# Patient Record
Sex: Female | Born: 1975 | Hispanic: No | Marital: Married | Smoking: Never smoker
Health system: Southern US, Community
[De-identification: ages and names within clinical notes are randomized; demographics above are authoritative.]

## PROBLEM LIST (undated history)

## (undated) DIAGNOSIS — F99 Mental disorder, not otherwise specified: Secondary | ICD-10-CM

## (undated) DIAGNOSIS — F32A Depression, unspecified: Secondary | ICD-10-CM

## (undated) DIAGNOSIS — F329 Major depressive disorder, single episode, unspecified: Secondary | ICD-10-CM

---

## 2000-04-11 ENCOUNTER — Other Ambulatory Visit: Admission: RE | Admit: 2000-04-11 | Discharge: 2000-04-11 | Payer: Self-pay | Admitting: Obstetrics and Gynecology

## 2000-10-20 ENCOUNTER — Inpatient Hospital Stay (HOSPITAL_COMMUNITY): Admission: AD | Admit: 2000-10-20 | Discharge: 2000-10-23 | Payer: Self-pay | Admitting: Obstetrics

## 2002-06-21 ENCOUNTER — Other Ambulatory Visit: Admission: RE | Admit: 2002-06-21 | Discharge: 2002-06-21 | Payer: Self-pay | Admitting: Obstetrics and Gynecology

## 2002-12-07 ENCOUNTER — Inpatient Hospital Stay (HOSPITAL_COMMUNITY): Admission: AD | Admit: 2002-12-07 | Discharge: 2002-12-09 | Payer: Self-pay | Admitting: *Deleted

## 2006-11-02 ENCOUNTER — Inpatient Hospital Stay (HOSPITAL_COMMUNITY): Admission: AD | Admit: 2006-11-02 | Discharge: 2006-11-04 | Payer: Self-pay | Admitting: Obstetrics

## 2011-03-05 ENCOUNTER — Inpatient Hospital Stay (HOSPITAL_COMMUNITY)
Admission: AD | Admit: 2011-03-05 | Discharge: 2011-03-05 | Disposition: A | Discharge status: Home / Self Care | Payer: Self-pay | Source: Ambulatory Visit | Attending: Obstetrics | Admitting: Obstetrics

## 2011-03-05 DIAGNOSIS — O368190 Decreased fetal movements, unspecified trimester, not applicable or unspecified: Secondary | ICD-10-CM | POA: Insufficient documentation

## 2011-03-08 ENCOUNTER — Inpatient Hospital Stay (HOSPITAL_COMMUNITY): Admission: AD | Admit: 2011-03-08 | Payer: Self-pay | Admitting: Obstetrics

## 2011-03-23 ENCOUNTER — Inpatient Hospital Stay (HOSPITAL_COMMUNITY)
Admission: AD | Admit: 2011-03-23 | Discharge: 2011-03-25 | DRG: 775 | Disposition: A | Discharge status: Home / Self Care | Payer: Medicaid Other | Source: Ambulatory Visit | Attending: Obstetrics | Admitting: Obstetrics

## 2011-03-23 LAB — RPR: RPR Ser Ql: NONREACTIVE

## 2011-03-23 LAB — CBC
HCT: 37.2 % (ref 36.0–46.0)
MCH: 27.8 pg (ref 26.0–34.0)
MCHC: 33.6 g/dL (ref 30.0–36.0)
RDW: 14.7 % (ref 11.5–15.5)

## 2011-03-24 LAB — CBC
HCT: 33 % — ABNORMAL LOW (ref 36.0–46.0)
Hemoglobin: 10.9 g/dL — ABNORMAL LOW (ref 12.0–15.0)
MCH: 27.7 pg (ref 26.0–34.0)
MCHC: 33 g/dL (ref 30.0–36.0)
RDW: 14.9 % (ref 11.5–15.5)

## 2012-09-15 ENCOUNTER — Inpatient Hospital Stay (HOSPITAL_COMMUNITY)
Admission: AD | Admit: 2012-09-15 | Discharge: 2012-09-16 | DRG: 775 | Disposition: A | Discharge status: Home / Self Care | Payer: Medicaid Other | Source: Ambulatory Visit | Attending: Obstetrics | Admitting: Obstetrics

## 2012-09-15 ENCOUNTER — Encounter (HOSPITAL_COMMUNITY): Payer: Self-pay | Admitting: *Deleted

## 2012-09-15 HISTORY — DX: Depression, unspecified: F32.A

## 2012-09-15 HISTORY — DX: Major depressive disorder, single episode, unspecified: F32.9

## 2012-09-15 HISTORY — DX: Mental disorder, not otherwise specified: F99

## 2012-09-15 MED ORDER — LANOLIN HYDROUS EX OINT: TOPICAL_OINTMENT | CUTANEOUS | Status: DC | PRN

## 2012-09-15 MED ORDER — OXYCODONE-ACETAMINOPHEN 5-325 MG PO TABS: 1.0000 | ORAL_TABLET | ORAL | Status: DC | PRN

## 2012-09-15 MED ORDER — OXYTOCIN 10 UNIT/ML IJ SOLN
10.0000 [IU] | Freq: Once | INTRAMUSCULAR | Status: AC
  Administered 2012-09-15: 10 [IU] via INTRAMUSCULAR

## 2012-09-15 MED ORDER — DIPHENHYDRAMINE HCL 25 MG PO CAPS: 25.0000 mg | ORAL_CAPSULE | Freq: Four times a day (QID) | ORAL | Status: DC | PRN

## 2012-09-15 MED ORDER — BENZOCAINE-MENTHOL 20-0.5 % EX AERO: 1.0000 "application " | INHALATION_SPRAY | CUTANEOUS | Status: DC | PRN

## 2012-09-15 MED ORDER — SENNOSIDES-DOCUSATE SODIUM 8.6-50 MG PO TABS
2.0000 | ORAL_TABLET | Freq: Every day | ORAL | Status: DC
  Administered 2012-09-15: 2 via ORAL

## 2012-09-15 MED ORDER — ONDANSETRON HCL 4 MG PO TABS: 4.0000 mg | ORAL_TABLET | ORAL | Status: DC | PRN

## 2012-09-15 MED ORDER — OXYTOCIN 10 UNIT/ML IJ SOLN
INTRAMUSCULAR | Status: AC
  Filled 2012-09-15: qty 1

## 2012-09-15 MED ORDER — PRENATAL MULTIVITAMIN CH
1.0000 | ORAL_TABLET | Freq: Every day | ORAL | Status: DC
  Administered 2012-09-15 – 2012-09-16 (×2): 1 via ORAL
  Filled 2012-09-15 (×2): qty 1

## 2012-09-15 MED ORDER — TETANUS-DIPHTH-ACELL PERTUSSIS 5-2.5-18.5 LF-MCG/0.5 IM SUSP
0.5000 mL | Freq: Once | INTRAMUSCULAR | Status: AC
  Administered 2012-09-16: 0.5 mL via INTRAMUSCULAR
  Filled 2012-09-15: qty 0.5

## 2012-09-15 MED ORDER — SIMETHICONE 80 MG PO CHEW: 80.0000 mg | CHEWABLE_TABLET | ORAL | Status: DC | PRN

## 2012-09-15 MED ORDER — ERYTHROMYCIN 5 MG/GM OP OINT
TOPICAL_OINTMENT | OPHTHALMIC | Status: AC
  Filled 2012-09-15: qty 1

## 2012-09-15 MED ORDER — DIBUCAINE 1 % RE OINT: 1.0000 "application " | TOPICAL_OINTMENT | RECTAL | Status: DC | PRN

## 2012-09-15 MED ORDER — ZOLPIDEM TARTRATE 5 MG PO TABS: 5.0000 mg | ORAL_TABLET | Freq: Every evening | ORAL | Status: DC | PRN

## 2012-09-15 MED ORDER — FERROUS SULFATE 325 (65 FE) MG PO TABS
325.0000 mg | ORAL_TABLET | Freq: Two times a day (BID) | ORAL | Status: DC
  Administered 2012-09-15 – 2012-09-16 (×3): 325 mg via ORAL
  Filled 2012-09-15 (×3): qty 1

## 2012-09-15 MED ORDER — ONDANSETRON HCL 4 MG/2ML IJ SOLN: 4.0000 mg | INTRAMUSCULAR | Status: DC | PRN

## 2012-09-15 MED ORDER — IBUPROFEN 600 MG PO TABS
600.0000 mg | ORAL_TABLET | Freq: Four times a day (QID) | ORAL | Status: DC
  Administered 2012-09-15 – 2012-09-16 (×6): 600 mg via ORAL
  Filled 2012-09-15 (×7): qty 1

## 2012-09-15 MED ORDER — WITCH HAZEL-GLYCERIN EX PADS: 1.0000 "application " | MEDICATED_PAD | CUTANEOUS | Status: DC | PRN

## 2012-09-15 NOTE — Progress Notes (Signed)
Patient ID: Olivia Raymond, female   DOB: [birth year], 36 y.o.   MRN: [id number] Vital signs normal fundus firm lochia moderate no complaints doing well and

## 2012-09-15 NOTE — H&P (Signed)
This is Dr. Francoise Ceo dictating the history and physical on  Olivia Raymond she's a 36 year old gravida 5  For all all 4 at 68 weeks and 6 days [name] 09/16/2012 negative GBS the patient states her contractions began at home she had 2 contractions her membranes ruptured and she came to the hospital where she was delivered by the midwife in triage she had a female Apgar 89 placenta was spontaneous there were no lacerations and she received 10 units of Pitocin IM post delivery Past medical history negative Past surgical history negative Family history negative System review negative Physical exam well-developed female post delivery HEENT negative Breasts negative Heart regular rhythm no murmurs no gallops Lungs clear Abdomen 20 week size postpartum Pelvic there were no lacerations the uterus was firm and her lochia was moderate Extremities negative

## 2012-09-15 NOTE — MAU Note (Signed)
PT ARRIVED - BROUGHT TO ROOM VIA W/C.    VE  COMPLETE.   DELIVERED  VIABLE BABY GIRT AT 0440.

## 2012-09-15 NOTE — Progress Notes (Signed)
UR chart review completed.  

## 2012-09-15 NOTE — MAU Note (Signed)
Call to come to desk to get pt at 0433,  Per interpreter it is pt's 5th baby, pt very umcomfortable, taken to Room 5, SVE complete, call to faculty to standby, call to [facility] to come for delivery, SVD at [phone]

## 2012-09-15 NOTE — MAU Note (Signed)
DR MARSHALL  IN ROOM

## 2012-09-16 LAB — CBC
HCT: 31.9 % — ABNORMAL LOW (ref 36.0–46.0)
Hemoglobin: 10.7 g/dL — ABNORMAL LOW (ref 12.0–15.0)
MCHC: 33.5 g/dL (ref 30.0–36.0)

## 2012-09-16 NOTE — Discharge Summary (Signed)
Obstetric Discharge Summary Reason for Admission: onset of labor Prenatal Procedures: none Intrapartum Procedures: spontaneous vaginal delivery Postpartum Procedures: none Complications-Operative and Postpartum: none Hemoglobin  Date Value Range Status  09/16/2012 10.7* 12.0 - 15.0 g/dL Final     HCT  Date Value Range Status  09/16/2012 31.9* 36.0 - 46.0 % Final    Physical Exam:  General: alert Lochia: appropriate Uterine Fundus: firm Incision: healing well DVT Evaluation: No evidence of DVT seen on physical exam.  Discharge Diagnoses: Term Pregnancy-delivered  Discharge Information: Date: 09/16/2012 Activity: pelvic rest Diet: routine Medications: Percocet Condition: stable Instructions: refer to practice specific booklet Discharge to: home Follow-up Information    Call in 6 weeks to follow up.         Newborn Data: Live born female  Birth Weight: 8 lb 13.6 oz (4015 g) APGAR: 8, 9  Home with mother.  MARSHALL,BERNARD A 09/16/2012, 8:19 AM

## 2012-09-16 NOTE — Discharge Instructions (Signed)
Discharge instructions   You can wash your hair  Shower  Eat what you want  Drink what you want  See me in 6 weeks  Your ankles are going to swell more in the next 2 weeks than when pregnant  No sex for 6 weeks   Olivia Raymond A, MD 09/16/2012

## 2012-09-16 NOTE — Progress Notes (Signed)
Patient ID: Olivia Raymond, female   DOB: 1976-02-11, 36 y.o.   MRN: [id number]  postpartum day one Vital signs normal Fundus firm Lochia moderate No complaints wants discharge and and

## 2012-09-21 ENCOUNTER — Telehealth (HOSPITAL_COMMUNITY): Payer: Self-pay | Admitting: *Deleted

## 2012-09-21 NOTE — Telephone Encounter (Signed)
Resolve episode 

## 2014-08-08 ENCOUNTER — Encounter (HOSPITAL_COMMUNITY): Payer: Self-pay | Admitting: *Deleted

## 2018-10-07 NOTE — L&D Delivery Note (Addendum)
OB/GYN Faculty Practice Delivery Note  Olivia Raymond is a 43 y.o. [id number] s/p SVD at [redacted]w[redacted]d. She was admitted for SROM.   ROM: rupture date, rupture time, delivery date, or delivery time have not been documented with clear fluid GBS Status:  --Henderson Cloud (10/23 1106) Maximum Maternal Temperature:  Temp (24hrs), Avg:98.3 F (36.8 C), Min:98.2 F (36.8 C), Max:98.3 F (36.8 C)    Labor Progress: . Patient arrived at 4 cm dilation and labor progressed spontaneously.   Delivery Date/Time: 08/07/2019 at 2353 Delivery: Called to room and patient was complete and pushing. Head delivered in ROA position. No nuchal cord present. Shoulder and body delivered in usual fashion. Infant with spontaneous cry, placed on mother's abdomen, dried and stimulated. Cord clamped x 2 after 1-minute delay, and cut by FOB. Cord blood drawn. Placenta delivered spontaneously with gentle cord traction. Fundus firm with massage and Pitocin. Labia, perineum, vagina, and cervix inspected with no tears.   Placenta: Spontaneous, intact, central cord insertion, three-vessel cord Complications: None Lacerations: None EBL: 100 mL Analgesia: None  Infant: APGAR (1 MIN): 9   APGAR (5 MINS): 9    Weight: Pending  Gifford Shave, MD  PGY-1, [facility]  08/07/2019 12:15 AM  Patient is a 43 y.o. at [redacted]w[redacted]d who was admitted for SROM, significant hx of A1GDM, LGA, AMA.  She progressed without augmentation.  I was gloved and present for delivery in its entirety.  Second stage of labor progressed, baby delivered after 2 contractions.   Complications: none  Lacerations: none   Wende Mott, CNM 1:04 AM

## 2019-02-05 ENCOUNTER — Ambulatory Visit (INDEPENDENT_AMBULATORY_CARE_PROVIDER_SITE_OTHER): Payer: Self-pay | Admitting: Obstetrics & Gynecology

## 2019-02-05 ENCOUNTER — Other Ambulatory Visit: Payer: Self-pay

## 2019-02-05 ENCOUNTER — Encounter: Payer: Self-pay | Admitting: Obstetrics & Gynecology

## 2019-02-05 DIAGNOSIS — Z124 Encounter for screening for malignant neoplasm of cervix: Secondary | ICD-10-CM

## 2019-02-05 DIAGNOSIS — O09529 Supervision of elderly multigravida, unspecified trimester: Secondary | ICD-10-CM | POA: Insufficient documentation

## 2019-02-05 DIAGNOSIS — N898 Other specified noninflammatory disorders of vagina: Secondary | ICD-10-CM

## 2019-02-05 DIAGNOSIS — Z3480 Encounter for supervision of other normal pregnancy, unspecified trimester: Secondary | ICD-10-CM | POA: Insufficient documentation

## 2019-02-05 DIAGNOSIS — Z113 Encounter for screening for infections with a predominantly sexual mode of transmission: Secondary | ICD-10-CM

## 2019-02-05 DIAGNOSIS — O09521 Supervision of elderly multigravida, first trimester: Secondary | ICD-10-CM

## 2019-02-05 DIAGNOSIS — Z3A11 11 weeks gestation of pregnancy: Secondary | ICD-10-CM

## 2019-02-05 NOTE — Patient Instructions (Signed)
Embarazo despus de los 35aos  Pregnancy After Age 41  Las mujeres que quedan embarazadas despus de los 35aos corren un mayor riesgo de tener ciertos problemas en el embarazo. Esto se debe a que posiblemente las mujeres mayores ya tengan problemas de salud antes de quedar embarazadas. Las mujeres mayores que gozan de buena salud antes del embarazo pueden tener problemas durante el embarazo de todos modos. Estos problemas pueden afectar a la madre, al beb en gestacin (feto) o a ambos.  Cules son los riesgos para m?  Si tiene ms de 35aos y desea quedar embarazada o est embarazada, es posible que corra un riesgo mayor de que suceda lo siguiente:   No poder quedar embarazada (infertilidad).   Que el trabajo de parto comience antes (trabajo de parto prematuro).   Requerir un parto quirrgico (parto por cesrea).   Tener presin arterial alta (hipertensin).   Tener complicaciones durante el embarazo, como hipertensin arterial y otros sntomas (preeclampsia).   Tener diabetes durante el embarazo (diabetes gestacional).   Estar embarazada de ms de un beb.   Perder el beb en gestacin antes de las 20semanas (aborto espontneo) o despus de las 20semanas (muerte fetal) de embarazo.  Cules son los riesgos para el beb?  Los bebs cuyas madres tienen ms de 35aos de edad corren un riesgo mayor de que suceda lo siguiente:   Nacer antes de tiempo (prematuramente).   Tener bajo peso al nacer, es decir, menos de 5libras y 8onzas (2,5kg).   Tener defectos congnitos, como sndrome de Down y fisura palatina.   Tener complicaciones de salud, como problemas de crecimiento y desarrollo.  En qu difiere el cuidado prenatal para las mujeres de ms de 35aos?  Todas las mujeres deben visitar al mdico antes de intentar quedar embarazadas. Esto es especialmente importante para las mujeres de ms de 35aos. Informe al mdico acerca de lo siguiente:   Cualquier problema de salud que tenga.   Los  medicamentos que toma.   Los antecedentes familiares de problemas de salud o defectos relacionados con cromosomas.   Cualquier problema que haya tenido con embarazos o partos anteriores.  Si tiene ms de 35aos de edad y est planeando quedar embarazada:   Comience a tomar un multivitamnico diario un mes antes de intentar quedar embarazada o ms. El multivitamnico debe contener 400?g (microgramos) de cido flico.  Si tiene ms de 35aos y est embarazada, asegrese de:   Seguir tomando el multivitamnico a menos que el mdico le diga que no lo haga.   Concurra a todas las visitas prenatales tal como se lo haya indicado el mdico. Esto es importante.   Hgase ecografas con regularidad a lo largo de todo el embarazo para detectar cualquier problema.   Hable con el mdico sobre otras pruebas de deteccin prenatales que podra necesitar.  Qu pruebas prenatales adicionales son necesarias?  Las pruebas de deteccin muestran si el beb corre un riesgo ms alto de tener defectos congnitos que otros bebs. Las siguientes son algunas de las pruebas de deteccin:   Ecografas para buscar marcadores que indican un riesgo de defectos congnitos.   Anlisis de deteccin en sangre materna. Estos son anlisis de sangre que miden ciertas sustancias en la sangre materna para determinar qu riesgo hay de que el beb tenga defectos.  Las pruebas de deteccin no indican si el beb tiene o no defectos. Solo indican el riesgo que existe de que el beb tenga ciertos defectos. Si las pruebas de deteccin indican que   hay algn factor de riesgo presente, quizs deba hacerse estudios para confirmar el defecto (pruebas de diagnstico). Estos estudios pueden incluir los siguientes:   Muestras de vellosidades corinicas. Para este procedimiento, se toma una muestra de tejido del rgano que se forma en el tero para nutrir al beb (placenta). La muestra se extrae a travs del cuello uterino o del abdomen y se la  analiza.   Amniocentesis. Para este procedimiento, se extrae y se analiza una pequea cantidad del lquido que rodea al beb en el tero (lquido amnitico).  Qu puedo hacer para preservar mi salud durante el embarazo?  Gozar de buena salud durante el embarazo puede ayudar a que exista un riesgo menor de que usted y el beb tengan problemas durante el embarazo, el parto o ambos. Hable con el mdico para que le d indicaciones especficas sobre cmo preservar su salud durante el embarazo.  Nutricin     En cada comida, consuma alimentos variados de cada uno de los cinco grupos. Estos grupos incluyen lo siguiente:  ? Protenas, por ejemplo, carnes magras, carne de ave, pescado con bajo contenido de grasas, frijoles, huevos y frutos secos.  ? Verduras, por ejemplo, de hoja, verduras crudas y cocidas, y jugo de verduras.  ? Frutas frescas, congeladas o enlatadas, o jugos 100% de frutas.  ? Productos lcteos, como yogur, queso y leche descremados.  ? Productos integrales, como arroz, cereales, pastas y pan.   Hable con el mdico sobre qu cantidad de alimentos de cada grupo es adecuada para usted.   Siga las indicaciones del mdico respecto a las restricciones de comidas y bebidas durante el embarazo.  ? No consuma huevos crudos, carne cruda ni pescado o mariscos crudos.  ? No consuma ningn pescado que contenga grandes cantidades de mercurio, como el pez espada o la caballa.   Beba entre 6y 8vasos de agua por da o ms. Debe beber suficiente cantidad de lquido para mantener la orina de color amarillo plido.  Cmo controlar el aumento de peso   Pregntele al mdico qu aumento de peso es saludable durante del embarazo.   Mantenga un peso saludable. De ser necesario, trabaje con su mdico para bajar de peso de manera segura.  Actividad   Haga ejercicio regularmente como se lo haya indicado el mdico. Consulte al mdico qu tipos de ejercicios son seguros para usted.  Instrucciones generales   No consuma  ningn producto que contenga nicotina o tabaco, como cigarrillos y cigarrillos electrnicos. Si necesita ayuda para dejar de fumar, consulte al mdico.   No beba alcohol, no consuma drogas y no abuse de los medicamentos recetados.   Tome los medicamentos de venta libre y los recetados solamente como se lo haya indicado el mdico.   No se d baos de inmersin en agua caliente, baos turcos ni saunas.   Hable con su mdico sobre el riesgo de exposicin a condiciones ambientales nocivas. Esto incluye la exposicin a sustancias qumicas, radiacin, productos de limpieza y heces de gato. Siga el consejo de su mdico respecto a cmo limitar la exposicin.  Resumen   Las mujeres que quedan embarazadas despus de los 35aos corren un mayor riesgo de tener complicaciones en el embarazo.   Los problemas pueden afectar a la madre, al beb en gestacin (feto) o a ambos.   Todas las mujeres deben visitar al mdico antes de intentar quedar embarazadas. Esto es especialmente importante para las mujeres de ms de 35aos.   Gozar de buena salud durante el embarazo   puede ayudar a que exista un riesgo menor de que tanto usted como el beb tengan algunos de los problemas que pueden surgir durante el embarazo, el parto o ambos.  Esta informacin no tiene como fin reemplazar el consejo del mdico. Asegrese de hacerle al mdico cualquier pregunta que tenga.  Document Released: 06/05/2017 Document Revised: 06/05/2017 Document Reviewed: 06/05/2017  Elsevier Interactive Patient Education  2019 Elsevier Inc.

## 2019-02-05 NOTE — Progress Notes (Signed)
  Subjective:    Olivia Raymond is a [id number] [redacted]w[redacted]d being seen today for her first obstetrical visit.  Her obstetrical history is significant for advanced maternal age. Patient does intend to breast feed. Pregnancy history fully reviewed.  Patient reports nausea.  Vitals:   02/05/19 0852  BP: 114/78  Pulse: 86  Temp: (!) 97.1 F (36.2 C)  Weight: 185 lb 14.4 oz (84.3 kg)    HISTORY: OB History  Gravida Para Term Preterm AB Living  6 5 5     5   SAB TAB Ectopic Multiple Live Births          5    # Outcome Date GA Lbr Len/2nd Weight Sex Delivery Anes PTL Lv  6 Current           5 Term 09/15/12 [redacted]w[redacted]d   F Vag-Spont None  LIV  4 Term 03/23/11     Vag-Spont   LIV  3 Term 11/02/06     Vag-Spont   LIV  2 Term 12/07/02     Vag-Spont   LIV  1 Term 10/21/00     Vag-Spont   LIV   Past Medical History:  Diagnosis Date  . Depression   . Mental disorder    History reviewed. No pertinent surgical history. History reviewed. No pertinent family history.   Exam    Uterus:     Pelvic Exam:    Perineum: No Hemorrhoids   Vulva: normal   Vagina:  normal mucosa   pH:     Cervix: no lesions   Adnexa: normal adnexa   Bony Pelvis: average  System: Breast:  normal appearance, no masses or tenderness   Skin: normal coloration and turgor, no rashes    Neurologic: oriented, normal mood   Extremities: normal strength, tone, and muscle mass   HEENT PERRLA   Mouth/Teeth mucous membranes moist, pharynx normal without lesions and dental hygiene good   Neck supple   Cardiovascular: regular rate and rhythm, no murmurs or gallops   Respiratory:  appears well, vitals normal, no respiratory distress, acyanotic, normal RR, neck free of mass or lymphadenopathy, chest clear, no wheezing, crepitations, rhonchi, normal symmetric air entry   Abdomen: soft, non-tender; bowel sounds normal; no masses,  no organomegaly   Urinary: urethral meatus normal      Assessment:    Pregnancy:  [id number] Patient Active Problem List   Diagnosis Date Noted  . Supervision of other normal pregnancy, antepartum 02/05/2019  . AMA (advanced maternal age) multigravida 35+ 02/05/2019        Plan:     Initial labs drawn. Prenatal vitamins. Problem list reviewed and updated. Genetic Screening discussed Panorama ordered Detailed U/S at [facility] Babyscripts Hb A1c  Scheryl Darter 02/05/2019

## 2019-02-05 NOTE — Progress Notes (Signed)
Patient is in the office for NOB, denies pain. [employer]. Pt wants genetic testing, provided application for compassionate care.

## 2019-02-06 LAB — OBSTETRIC PANEL, INCLUDING HIV
Antibody Screen: NEGATIVE
Basophils Absolute: 0.1 10*3/uL (ref 0.0–0.2)
Basos: 1 %
EOS (ABSOLUTE): 0.1 10*3/uL (ref 0.0–0.4)
Eos: 1 %
HIV Screen 4th Generation wRfx: NONREACTIVE
Hematocrit: 40.3 % (ref 34.0–46.6)
Hemoglobin: 13.2 g/dL (ref 11.1–15.9)
Hepatitis B Surface Ag: NEGATIVE
Immature Grans (Abs): 0.1 10*3/uL (ref 0.0–0.1)
Immature Granulocytes: 1 %
Lymphocytes Absolute: 2.3 10*3/uL (ref 0.7–3.1)
Lymphs: 26 %
MCH: 28.4 pg (ref 26.6–33.0)
MCHC: 32.8 g/dL (ref 31.5–35.7)
MCV: 87 fL (ref 79–97)
Monocytes Absolute: 0.5 10*3/uL (ref 0.1–0.9)
Monocytes: 5 %
Neutrophils Absolute: 5.9 10*3/uL (ref 1.4–7.0)
Neutrophils: 66 %
Platelets: 272 10*3/uL (ref 150–450)
RBC: 4.64 x10E6/uL (ref 3.77–5.28)
RDW: 13.3 % (ref 11.7–15.4)
RPR Ser Ql: NONREACTIVE
Rh Factor: POSITIVE
Rubella Antibodies, IGG: 5.68 index (ref >0.99)
WBC: 8.8 10*3/uL (ref 3.4–10.8)

## 2019-02-07 LAB — CULTURE, OB URINE

## 2019-02-07 LAB — URINE CULTURE, OB REFLEX

## 2019-02-09 LAB — CERVICOVAGINAL ANCILLARY ONLY
Bacterial vaginitis: NEGATIVE
Candida vaginitis: NEGATIVE
Chlamydia: NEGATIVE
Neisseria Gonorrhea: NEGATIVE
Trichomonas: NEGATIVE

## 2019-02-11 LAB — CYTOLOGY - PAP
Diagnosis: NEGATIVE
HPV: NOT DETECTED

## 2019-02-15 ENCOUNTER — Encounter: Payer: Self-pay | Admitting: Obstetrics & Gynecology

## 2019-02-15 DIAGNOSIS — Z3480 Encounter for supervision of other normal pregnancy, unspecified trimester: Secondary | ICD-10-CM

## 2019-04-02 ENCOUNTER — Ambulatory Visit (INDEPENDENT_AMBULATORY_CARE_PROVIDER_SITE_OTHER): Payer: Self-pay | Admitting: Obstetrics and Gynecology

## 2019-04-02 ENCOUNTER — Encounter: Payer: Self-pay | Admitting: Obstetrics and Gynecology

## 2019-04-02 ENCOUNTER — Telehealth: Payer: Self-pay | Admitting: *Deleted

## 2019-04-02 VITALS — BP 110/70 | HR 89

## 2019-04-02 DIAGNOSIS — O09522 Supervision of elderly multigravida, second trimester: Secondary | ICD-10-CM

## 2019-04-02 DIAGNOSIS — Z3480 Encounter for supervision of other normal pregnancy, unspecified trimester: Secondary | ICD-10-CM

## 2019-04-02 DIAGNOSIS — Z789 Other specified health status: Secondary | ICD-10-CM

## 2019-04-02 DIAGNOSIS — Z758 Other problems related to medical facilities and other health care: Secondary | ICD-10-CM

## 2019-04-02 DIAGNOSIS — Z3A19 19 weeks gestation of pregnancy: Secondary | ICD-10-CM

## 2019-04-02 NOTE — Progress Notes (Addendum)
   TELEHEALTH VIRTUAL OBSTETRICS VISIT ENCOUNTER NOTE  I connected with Olivia Raymond on 04/02/19 at  8:45 AM EDT by telephone at home and verified that I am speaking with the correct person using two identifiers.   I discussed the limitations, risks, security and privacy concerns of performing an evaluation and management service by telephone and the availability of in person appointments. I also discussed with the patient that there may be a patient responsible charge related to this service. The patient expressed understanding and agreed to proceed.  Subjective:  Olivia Raymond is a 43 y.o. [id number] at [redacted]w[redacted]d being followed for ongoing prenatal care.  She is currently monitored for the following issues for this high-risk pregnancy and has Supervision of other normal pregnancy, antepartum; AMA (advanced maternal age) multigravida 35+; and Language barrier on their problem list.  Patient reports no complaints. Reports fetal movement. Denies any contractions, bleeding or leaking of fluid.   The following portions of the patient's history were reviewed and updated as appropriate: allergies, current medications, past family history, past medical history, past social history, past surgical history and problem list.   Objective:   BP 110/70, pulse 89  General:  Alert, oriented and cooperative.   Mental Status: Normal mood and affect perceived. Normal judgment and thought content.  Rest of physical exam deferred due to type of encounter  Assessment and Plan:  Pregnancy: G6P5005 at [redacted]w[redacted]d  1. Supervision of other normal pregnancy, antepartum Requested anatomy scan results from [facility] Reviewed other results  2. Multigravida of advanced maternal age in second trimester Testing starting 36 weeks  3. Language barrier Patent attorney used  4. Low lying placenta Ultrasound available after I hung up with patient, spine not well seen and noted to have low lying  placenta < 4 mm from os - RN to call patient and review pelvic precautions with her, that this will most likely resolve as her pregnancy progresses - repeat US at [place] 1 month  Preterm labor symptoms and general obstetric precautions including but not limited to vaginal bleeding, contractions, leaking of fluid and fetal movement were reviewed in detail with the patient.  I discussed the assessment and treatment plan with the patient. The patient was provided an opportunity to ask questions and all were answered. The patient agreed with the plan and demonstrated an understanding of the instructions. The patient was advised to call back or seek an in-person office evaluation/go to [facility] at [facility] for any urgent or concerning symptoms. Please refer to After Visit Summary for other counseling recommendations.   I provided 15 minutes of non-face-to-face time during this encounter.  Return in about 4 weeks (around 04/30/2019) for OB visit, virtual.  No future appointments.  Sloan Leiter, MD Center for [place], [place]

## 2019-04-02 NOTE — Telephone Encounter (Signed)
Intrepreter ID # [id number] used for today's telephone call.  Pt was made aware that she needs follow up u/s due to incomplete anatomy and low lying placenta, per Dr Rosana Hoes. Pt was given pelvic precautions until given clearance by provider.   Pt was given appt date/time for u/s appt and ROB appt.  Pt states understanding and has no other concerns.

## 2019-04-30 ENCOUNTER — Encounter: Payer: Self-pay | Admitting: Obstetrics and Gynecology

## 2019-04-30 ENCOUNTER — Ambulatory Visit (INDEPENDENT_AMBULATORY_CARE_PROVIDER_SITE_OTHER): Payer: Self-pay | Admitting: Obstetrics and Gynecology

## 2019-04-30 VITALS — BP 114/70 | HR 76

## 2019-04-30 DIAGNOSIS — Z3A23 23 weeks gestation of pregnancy: Secondary | ICD-10-CM

## 2019-04-30 DIAGNOSIS — Z789 Other specified health status: Secondary | ICD-10-CM

## 2019-04-30 DIAGNOSIS — Z3482 Encounter for supervision of other normal pregnancy, second trimester: Secondary | ICD-10-CM

## 2019-04-30 DIAGNOSIS — Z3480 Encounter for supervision of other normal pregnancy, unspecified trimester: Secondary | ICD-10-CM

## 2019-04-30 NOTE — Progress Notes (Signed)
   TELEHEALTH VIRTUAL OBSTETRICS VISIT ENCOUNTER NOTE  I connected with Olivia Raymond on 04/30/19 at  9:15 AM EDT by telephone at home and verified that I am speaking with the correct person using two identifiers.   I discussed the limitations, risks, security and privacy concerns of performing an evaluation and management service by telephone and the availability of in person appointments. I also discussed with the patient that there may be a patient responsible charge related to this service. The patient expressed understanding and agreed to proceed.  Subjective:  Olivia Raymond is a 43 y.o. G6P5005 at [redacted]w[redacted]d being followed for ongoing prenatal care.  She is currently monitored for the following issues for this high-risk pregnancy and has Supervision of other normal pregnancy, antepartum; AMA (advanced maternal age) multigravida 35+; and Language barrier on their problem list.  Patient reports no complaints. Reports fetal movement. Denies any contractions, bleeding or leaking of fluid.   The following portions of the patient's history were reviewed and updated as appropriate: allergies, current medications, past family history, past medical history, past social history, past surgical history and problem list.   Objective:   General:  Alert, oriented and cooperative.   Mental Status: Normal mood and affect perceived. Normal judgment and thought content.  Rest of physical exam deferred due to type of encounter  Assessment and Plan:  Pregnancy: G6P5005 at [redacted]w[redacted]d 1. Supervision of other normal pregnancy, antepartum Stable MFM U/S ordered Glucola next visit  2. Language barrier   Preterm labor symptoms and general obstetric precautions including but not limited to vaginal bleeding, contractions, leaking of fluid and fetal movement were reviewed in detail with the patient.  I discussed the assessment and treatment plan with the patient. The patient was provided  an opportunity to ask questions and all were answered. The patient agreed with the plan and demonstrated an understanding of the instructions. The patient was advised to call back or seek an in-person office evaluation/go to [facility]U at [facility] for any urgent or concerning symptoms. Please refer to After Visit Summary for other counseling recommendations.   I provided 8 minutes of non-face-to-face time during this encounter.  Return in about 4 weeks (around 05/28/2019) for OB visit,face to face for glucola.  No future appointments.  Chancy Milroy, MD Center for [place], [place]

## 2019-04-30 NOTE — Progress Notes (Signed)
Pt is on the phone preparing for televisit. ROB, [redacted]w[redacted]d. Had [place] with [place] on 04/26/19.

## 2019-05-28 ENCOUNTER — Ambulatory Visit (INDEPENDENT_AMBULATORY_CARE_PROVIDER_SITE_OTHER): Payer: Self-pay | Admitting: Family Medicine

## 2019-05-28 ENCOUNTER — Other Ambulatory Visit: Payer: Self-pay

## 2019-05-28 VITALS — BP 118/74 | HR 96 | Wt 198.2 lb

## 2019-05-28 DIAGNOSIS — Z789 Other specified health status: Secondary | ICD-10-CM

## 2019-05-28 DIAGNOSIS — O3663X0 Maternal care for excessive fetal growth, third trimester, not applicable or unspecified: Secondary | ICD-10-CM

## 2019-05-28 DIAGNOSIS — O403XX0 Polyhydramnios, third trimester, not applicable or unspecified: Secondary | ICD-10-CM

## 2019-05-28 DIAGNOSIS — Z3480 Encounter for supervision of other normal pregnancy, unspecified trimester: Secondary | ICD-10-CM

## 2019-05-28 DIAGNOSIS — O3660X0 Maternal care for excessive fetal growth, unspecified trimester, not applicable or unspecified: Secondary | ICD-10-CM | POA: Insufficient documentation

## 2019-05-28 DIAGNOSIS — O09523 Supervision of elderly multigravida, third trimester: Secondary | ICD-10-CM

## 2019-05-28 DIAGNOSIS — Z3A27 27 weeks gestation of pregnancy: Secondary | ICD-10-CM

## 2019-05-28 DIAGNOSIS — O409XX0 Polyhydramnios, unspecified trimester, not applicable or unspecified: Secondary | ICD-10-CM | POA: Insufficient documentation

## 2019-05-28 NOTE — Progress Notes (Signed)
    PRENATAL VISIT NOTE  Subjective:  Olivia Raymond is a 43 y.o. G6P5005 at [redacted]w[redacted]d being seen today for ongoing prenatal care.  She is currently monitored for the following issues for this high-risk pregnancy and has Supervision of other normal pregnancy, antepartum; AMA (advanced maternal age) multigravida 73+; Language barrier; Polyhydramnios; and LGA (large for gestational age) fetus affecting management of mother on their problem list.  Patient reports no complaints.  Contractions: Irregular. Vag. Bleeding: None.  Movement: Present. Denies leaking of fluid.   The following portions of the patient's history were reviewed and updated as appropriate: allergies, current medications, past family history, past medical history, past social history, past surgical history and problem list.   Objective:   Vitals:   05/28/19 0839  BP: 118/74  Pulse: 96  Weight: 198 lb 3.2 oz (89.9 kg)    Fetal Status: Fetal Heart Rate (bpm): 150 Fundal Height: 29 cm Movement: Present     General:  Alert, oriented and cooperative. Patient is in no acute distress.  Skin: Skin is warm and dry. No rash noted.   Cardiovascular: Normal heart rate noted  Respiratory: Normal respiratory effort, no problems with respiration noted  Abdomen: Soft, gravid, appropriate for gestational age.  Pain/Pressure: Present     Pelvic: Cervical exam deferred        Extremities: Normal range of motion.  Edema: Trace  Mental Status: Normal mood and affect. Normal behavior. Normal judgment and thought content.   Assessment and Plan:  Pregnancy: [id number] at [redacted]w[redacted]d 1. Supervision of other normal pregnancy, antepartum 28 wk labs [facility]daP at [facility] - Glucose Tolerance, 2 Hours w/1 Hour - CBC - HIV antibody (with reflex) - RPR  2. Multigravida of advanced maternal age in third trimester Nml NIPT  3. Language barrier Spanish interpreter: in person used   4. Polyhydramnios in third trimester complication, single or  unspecified fetus Found at f/u anatomy for MFM scan next week. No early glucola.  5. Excessive fetal growth affecting management of pregnancy in third trimester, single or unspecified fetus Found at f/u anatomy for MFM scan next week, Bi early glucola  Preterm labor symptoms and general obstetric precautions including but not limited to vaginal bleeding, contractions, leaking of fluid and fetal movement were reviewed in detail with the patient. Please refer to After Visit Summary for other counseling recommendations.   Return in 2 weeks (on 06/11/2019) for [facility], in person.  Future Appointments  Date Time Provider [place]  06/01/2019  8:00 AM [place] [facility]  06/01/2019  8:00 AM [facility] [place] 3 [facility]  06/11/2019  8:15 AM Constant, Vickii Chafe, MD [facility] None    Donnamae Jude, MD

## 2019-05-28 NOTE — Progress Notes (Signed)
ROB/GTT. Letter given to get TDAP at [facility].  C/o pressure, contractions and Vaginal discharge.

## 2019-05-28 NOTE — Patient Instructions (Signed)

## 2019-05-29 LAB — CBC
Hematocrit: 37.4 % (ref 34.0–46.6)
Hemoglobin: 12.7 g/dL (ref 11.1–15.9)
MCH: 28.9 pg (ref 26.6–33.0)
MCHC: 34 g/dL (ref 31.5–35.7)
MCV: 85 fL (ref 79–97)
Platelets: 260 10*3/uL (ref 150–450)
RBC: 4.39 x10E6/uL (ref 3.77–5.28)
RDW: 14.2 % (ref 11.7–15.4)
WBC: 9 10*3/uL (ref 3.4–10.8)

## 2019-05-29 LAB — GLUCOSE TOLERANCE, 2 HOURS W/ 1HR
Glucose, 1 hour: 195 mg/dL — ABNORMAL HIGH (ref 65–179)
Glucose, 2 hour: 143 mg/dL (ref 65–152)
Glucose, Fasting: 76 mg/dL (ref 65–91)

## 2019-05-29 LAB — HIV ANTIBODY (ROUTINE TESTING W REFLEX): HIV Screen 4th Generation wRfx: NONREACTIVE

## 2019-05-29 LAB — RPR: RPR Ser Ql: NONREACTIVE

## 2019-05-31 ENCOUNTER — Encounter: Payer: Self-pay | Admitting: Family Medicine

## 2019-05-31 DIAGNOSIS — O24419 Gestational diabetes mellitus in pregnancy, unspecified control: Secondary | ICD-10-CM | POA: Insufficient documentation

## 2019-06-01 ENCOUNTER — Other Ambulatory Visit (HOSPITAL_COMMUNITY): Payer: Self-pay | Admitting: *Deleted

## 2019-06-01 ENCOUNTER — Ambulatory Visit (HOSPITAL_COMMUNITY)
Admission: RE | Admit: 2019-06-01 | Discharge: 2019-06-01 | Disposition: A | Discharge status: Home / Self Care | Payer: Self-pay | Source: Ambulatory Visit | Attending: Obstetrics and Gynecology | Admitting: Obstetrics and Gynecology

## 2019-06-01 ENCOUNTER — Other Ambulatory Visit: Payer: Self-pay

## 2019-06-01 ENCOUNTER — Encounter (HOSPITAL_COMMUNITY): Payer: Self-pay

## 2019-06-01 ENCOUNTER — Ambulatory Visit (HOSPITAL_COMMUNITY): Payer: Self-pay | Admitting: *Deleted

## 2019-06-01 VITALS — BP 109/69 | HR 85 | Temp 98.5°F

## 2019-06-01 DIAGNOSIS — O09523 Supervision of elderly multigravida, third trimester: Secondary | ICD-10-CM

## 2019-06-01 DIAGNOSIS — O403XX0 Polyhydramnios, third trimester, not applicable or unspecified: Secondary | ICD-10-CM | POA: Insufficient documentation

## 2019-06-01 DIAGNOSIS — Z3480 Encounter for supervision of other normal pregnancy, unspecified trimester: Secondary | ICD-10-CM | POA: Insufficient documentation

## 2019-06-01 DIAGNOSIS — O3663X0 Maternal care for excessive fetal growth, third trimester, not applicable or unspecified: Secondary | ICD-10-CM

## 2019-06-01 DIAGNOSIS — Z3A28 28 weeks gestation of pregnancy: Secondary | ICD-10-CM

## 2019-06-03 ENCOUNTER — Other Ambulatory Visit: Payer: Self-pay | Admitting: *Deleted

## 2019-06-03 DIAGNOSIS — O24419 Gestational diabetes mellitus in pregnancy, unspecified control: Secondary | ICD-10-CM

## 2019-06-03 DIAGNOSIS — Z3480 Encounter for supervision of other normal pregnancy, unspecified trimester: Secondary | ICD-10-CM

## 2019-06-03 MED ORDER — ACCU-CHEK FASTCLIX LANCETS MISC: 1.0000 | Freq: Four times a day (QID) | 5 refills | Status: DC

## 2019-06-03 MED ORDER — ACCU-CHEK GUIDE VI STRP: ORAL_STRIP | 12 refills | Status: DC

## 2019-06-03 NOTE — Progress Notes (Signed)
Spoke with pt via interpreter ID [phone]  Diabetic supplies to pharmacy today. Pt is to come by office to pick up meter device.  Pt advised to contact office if any problems with meter or supplies. Pt states she does not have time attend class and is concerned about cost.  Advised pt I will check to see if there is a virtual/telephone option to discuss diet.

## 2019-06-03 NOTE — Progress Notes (Signed)
N&D consult order for GDM

## 2019-06-09 ENCOUNTER — Telehealth: Payer: Self-pay

## 2019-06-09 ENCOUNTER — Other Ambulatory Visit: Payer: Self-pay

## 2019-06-09 DIAGNOSIS — Z20828 Contact with and (suspected) exposure to other viral communicable diseases: Secondary | ICD-10-CM

## 2019-06-09 DIAGNOSIS — Z20822 Contact with and (suspected) exposure to covid-19: Secondary | ICD-10-CM

## 2019-06-09 NOTE — Telephone Encounter (Signed)
Received call from patient stating she has been in contact with someone who has covid-19. She reports having body ache,cough, headache, and low grade temp that started over the weekend. Today she has a sore throat. I have advised her to go get tested today at the [facility] location or the [facility]. They will follow up with her results and give her instruction on self monitoring her symptoms while waiting on her results. Patient voice understanding with spanish interpreter line. I have placed an order in the epic under Dr.Constant since she is the lead provider at [facility].

## 2019-06-10 LAB — NOVEL CORONAVIRUS, NAA: SARS-CoV-2, NAA: DETECTED — AB

## 2019-06-11 ENCOUNTER — Ambulatory Visit (INDEPENDENT_AMBULATORY_CARE_PROVIDER_SITE_OTHER): Payer: Self-pay | Admitting: Obstetrics and Gynecology

## 2019-06-11 ENCOUNTER — Encounter: Payer: Self-pay | Admitting: Obstetrics and Gynecology

## 2019-06-11 VITALS — BP 106/70 | HR 79

## 2019-06-11 DIAGNOSIS — O3663X0 Maternal care for excessive fetal growth, third trimester, not applicable or unspecified: Secondary | ICD-10-CM

## 2019-06-11 DIAGNOSIS — Z3480 Encounter for supervision of other normal pregnancy, unspecified trimester: Secondary | ICD-10-CM

## 2019-06-11 DIAGNOSIS — Z3A29 29 weeks gestation of pregnancy: Secondary | ICD-10-CM

## 2019-06-11 DIAGNOSIS — O09523 Supervision of elderly multigravida, third trimester: Secondary | ICD-10-CM

## 2019-06-11 DIAGNOSIS — Z789 Other specified health status: Secondary | ICD-10-CM

## 2019-06-11 DIAGNOSIS — O403XX0 Polyhydramnios, third trimester, not applicable or unspecified: Secondary | ICD-10-CM

## 2019-06-11 DIAGNOSIS — O24410 Gestational diabetes mellitus in pregnancy, diet controlled: Secondary | ICD-10-CM

## 2019-06-11 NOTE — Progress Notes (Signed)
Pt has tested positive for Covid, pt is concerned about baby and how it's effects.  Pt has diabetic meter but does not know how to use it. Pt is not scheduled for diabetic class until 9/15.

## 2019-06-11 NOTE — Progress Notes (Signed)
[place] VIRTUAL VIDEO VISIT ENCOUNTER NOTE  Provider location: [facility] at [place]   I connected with Modesto Charon Jesus Garcia-Vazquez on 06/11/19 at  8:15 AM EDT by WebEx Encounter at home and verified that I am speaking with the correct person using two identifiers.   I discussed the limitations, risks, security and privacy concerns of performing an evaluation and management service virtually and the availability of in person appointments. I also discussed with the patient that there may be a patient responsible charge related to this service. The patient expressed understanding and agreed to proceed. Subjective:  Olivia Raymond is a 43 y.o. O1Y0737 at 23w4dbeing seen today for ongoing prenatal care.  She is currently monitored for the following issues for this high-risk pregnancy and has Supervision of other normal pregnancy, antepartum; AMA (advanced maternal age) multigravida 350+ Language barrier; Polyhydramnios; LGA (large for gestational age) fetus affecting management of mother; and Gestational diabetes on their problem list.  Patient reports no complaints.  Contractions: Not present. Vag. Bleeding: None.  Movement: Present. Denies any leaking of fluid.   The following portions of the patient's history were reviewed and updated as appropriate: allergies, current medications, past family history, past medical history, past social history, past surgical history and problem list.   Objective:   Vitals:   06/11/19 0826  BP: 106/70  Pulse: 79    Fetal Status:     Movement: Present     General:  Alert, oriented and cooperative. Patient is in no acute distress.  Respiratory: Normal respiratory effort, no problems with respiration noted  Mental Status: Normal mood and affect. Normal behavior. Normal judgment and thought content.  Rest of physical exam deferred due to type of encounter  Imaging: UKoreaMfm Ob Detail +14 Wk   Result Date: 06/01/2019 ----------------------------------------------------------------------  OBSTETRICS REPORT                       (Signed Final 06/01/2019 10:20 am) ---------------------------------------------------------------------- Patient Info  ID #:       [id number]                         D.O.B.:  011/13/1977(43 yrs)  Name:       MSherrell Puller             Visit Date: 06/01/2019 08:06 am              VAZQUEZ ---------------------------------------------------------------------- Performed By  Performed By:     HNovella Rob       Ref. Address:     [address]  [place], [place]  Attending:        Sander Nephew      [facility]                    MD                                       [facility]  Referred By:      Chancy Milroy                    MD ---------------------------------------------------------------------- Orders   #  Description                          Code         Ordered By   1  [place] MFM OB DETAIL +14 [place]              67209.47     Arlina Robes  ----------------------------------------------------------------------   #  Order #                    Accession #                 Episode #   1  [id number]                   [id number]                  [id number]  ---------------------------------------------------------------------- Indications   Polyhydramnios, third trimester, antepartum    O40.3XX0   condition or complication, unspecified fetus   Encounter for antenatal screening for          Z36.3   malformations (low risk NIPS)   Advanced maternal age multigravida 66+,        O67.523   third trimester   [redacted] weeks gestation of pregnancy                Z3A.28   Large for gestational age fetus affecting      O69.60X0   management of mother   ---------------------------------------------------------------------- Vital Signs  Weight (lb): 198 ---------------------------------------------------------------------- Fetal Evaluation  Num Of Fetuses:         1  Fetal Heart Rate(bpm):  140  Cardiac Activity:       Observed  Presentation:           Cephalic  Placenta:               Anterior  P. Cord Insertion:      Visualized, central  Amniotic Fluid  AFI FV:      Within normal limits  AFI Sum(cm)     %Tile       Largest Pocket(cm)  27.65           > 97        9.28  RUQ(cm)       RLQ(cm)       LUQ(cm)  LLQ(cm)  9.28          5.68          7.49           5.19 ---------------------------------------------------------------------- Biometry  BPD:      78.9  mm     G. Age:  31w 5d       > 99  %    CI:        78.69   %    70 - 86                                                          FL/HC:      19.0   %    18.8 - 20.6  HC:      281.3  mm     G. Age:  30w 6d         92  %    HC/AC:      0.96        1.05 - 1.21  AC:      291.8  mm     G. Age:  33w 1d       > 99  %    FL/BPD:     67.8   %    71 - 87  FL:       53.5  mm     G. Age:  28w 3d         42  %    FL/AC:      18.3   %    20 - 24  HUM:      45.4  mm     G. Age:  26w 6d         18  %  CER:      37.9  mm     G. Age:  32w 3d       > 95  %  LV:        5.3  mm  CM:        4.6  mm  Est. FW:    1760  gm    3 lb 14 oz    > 99  % ---------------------------------------------------------------------- OB History  Gravidity:    6         Term:   4         SAB:   1  Living:       4 ---------------------------------------------------------------------- Gestational Age  LMP:           28w 1d        Date:  11/16/18                 EDD:   08/23/19  U/S Today:     31w 0d                                        EDD:   08/03/19  Best:          28w 1d     Det. By:  LMP  (11/16/18)          EDD:   08/23/19 ---------------------------------------------------------------------- Anatomy  Cranium:  Appears normal          Aortic Arch:            Appears normal  Cavum:                 Appears normal         Ductal Arch:            Not well visualized  Ventricles:            Appears normal         Diaphragm:              Appears normal  Choroid Plexus:        Appears normal         Stomach:                Appears normal, left                                                                        sided  Cerebellum:            Appears normal         Abdomen:                Appears normal  Posterior Fossa:       Appears normal         Abdominal Wall:         Appears nml (cord                                                                        insert, abd wall)  Nuchal Fold:           Not applicable (>37    Cord Vessels:           Appears normal ([redacted]                         wks GA)                                        vessel cord)  Face:                  Appears normal         Kidneys:                Appear normal                         (orbits and profile)  Lips:                  Appears normal         Bladder:                Appears normal  Thoracic:  Appears normal         Spine:                  Appears normal  Heart:                 Appears normal         Upper Extremities:      Appears normal                         (4CH, axis, and                         situs)  RVOT:                  Appears normal         Lower Extremities:      Appears normal  LVOT:                  Appears normal  Other:  Heels and 5th digit visualized. 5th digit visualized. Rt heel visualized.          Technicallly difficult due to advanced GA and maternal habitus. ---------------------------------------------------------------------- Cervix Uterus Adnexa  Cervix  Not visualized (advanced GA >24wks)  Uterus  No abnormality visualized.  Left Ovary  Within normal limits.  Right Ovary  Within normal limits.  Cul De Sac  No free fluid seen.  Adnexa  No abnormality visualized. ----------------------------------------------------------------------  Impression  Normal interval growth.  No ultrasonic evidence of structural  fetal anomalies.  Mild polyhydramnios is noted today, glucola performed  1 h  was abnormal at 195, will need 3 hr GTT. We discussed the  causes of mild polyhydramnios including idiopathic,  gestational diabetes, infection, and genetic syndromes. Given  that the polyhydramnios is mild and she has an abnormal 1hr  GTT this is likely idiopathic vs. glucose intolerance. We  discussed evaluation, management and outcomes.  Low risk NIPS  Suboptimal views of the fetal ductal arch was obtatined. ---------------------------------------------------------------------- Recommendations  Follow up growth in 4 weeks given glucose intolerance and  polyhydramnios. ----------------------------------------------------------------------               Sander Nephew, MD Electronically Signed Final Report   06/01/2019 10:20 am ----------------------------------------------------------------------   Assessment and Plan:  Pregnancy: [id number] at 52w4d1. Supervision of other normal pregnancy, antepartum Patient is doing well without complaints. Patient with recent covid positive test on 9/2. Patient denies any fever, or respiratory symptoms.  2. Diet controlled gestational diabetes mellitus (GDM) in third trimester Patient with recent diagnosis of GDM. She has not met with diabetic educator nor has she been monitoring CBGs Patient scheduled to see diabetic educator on 9/15 Discussed importance of euglycemia to further avoid complication associated with poorly controlled diabetes including but not limited to risks of fetal macrosomia, shoulder dystocia, nerve damage, cesarean section, IUFD or neonatal seizures/death  3. Polyhydramnios in third trimester complication, single or unspecified fetus Follow up ultrasound scheduled 9/22  4. Excessive fetal growth affecting management of pregnancy in third trimester, single or unspecified fetus   5.  Multigravida of advanced maternal age in third trimester Low risks NIPS  6. Language barrier Spanish interpreter present during encounter  Preterm labor symptoms and general obstetric precautions including but not limited to vaginal bleeding, contractions, leaking of fluid and fetal movement were reviewed in detail with the patient. I discussed the assessment and treatment plan with the patient.  The patient was provided an opportunity to ask questions and all were answered. The patient agreed with the plan and demonstrated an understanding of the instructions. The patient was advised to call back or seek an in-person office evaluation/go to [facility] at [facility] for any urgent or concerning symptoms. Please refer to After Visit Summary for other counseling recommendations.   I provided 20 minutes of face-to-face time during this encounter.  Return in about 2 weeks (around 06/25/2019) for [facility], [place], High risk.  Future Appointments  Date Time Provider [place]  06/22/2019 10:15 AM Brush [place]  06/29/2019  1:15 PM [place] MFC-US  06/29/2019  1:15 PM [place] 4 WH-MFCUS MFC-US    Mora Bellman, MD [facility], [place]

## 2019-06-22 ENCOUNTER — Ambulatory Visit: Payer: Self-pay | Admitting: *Deleted

## 2019-06-22 ENCOUNTER — Encounter: Payer: Self-pay | Attending: Obstetrics & Gynecology | Admitting: *Deleted

## 2019-06-22 ENCOUNTER — Other Ambulatory Visit: Payer: Self-pay

## 2019-06-22 DIAGNOSIS — Z3A00 Weeks of gestation of pregnancy not specified: Secondary | ICD-10-CM | POA: Insufficient documentation

## 2019-06-22 DIAGNOSIS — O24410 Gestational diabetes mellitus in pregnancy, diet controlled: Secondary | ICD-10-CM | POA: Insufficient documentation

## 2019-06-22 NOTE — Progress Notes (Signed)
  Patient was seen on 06/22/2019 for Gestational Diabetes self-management. Patient speaks Spanish, we used Transport planner service for this visit. EDD 08/23/2019. Patient states no history of GDM. Diet history obtained. Patient eats good variety of all food groups. Beverages include water now, used to drink coffee with some sugar but has stopped for now.   The following learning objectives were met by the patient :   States the definition of Gestational Diabetes  States why dietary management is important in controlling blood glucose  Describes the effects of carbohydrates on blood glucose levels  Demonstrates ability to create a balanced meal plan  Demonstrates carbohydrate counting   States when to check blood glucose levels  Demonstrates proper blood glucose monitoring techniques  States the effect of stress and exercise on blood glucose levels  States the importance of limiting caffeine and abstaining from alcohol and smoking  Plan:  Aim for 3 Carb Choices per meal (45 grams) +/- 1 either way  Aim for 1-2 Carbs per snack Begin reading food labels for Total Carbohydrate of foods If OK with your MD, consider  increasing your activity level by walking, Arm Chair Exercises or other activity daily as tolerated Begin checking BG before breakfast and 2 hours after first bite of breakfast, lunch and dinner as directed by MD  Bring Log Book/Sheet and meter to every medical appointment  Baby Scripts:  Patient not appropriate for Baby Scripts due to language barrier  Take medication if directed by MD  Blood glucose monitor given: [employer] Code Lot # [id number] Blood glucose reading: 77 mg/dl  Patient instructed to monitor glucose levels: FBS: 60 - 95 mg/dl 2 hour: <120 mg/dl  Patient received the following handouts: in Spanish  Nutrition Diabetes and Pregnancy  Carbohydrate Counting List  BG Log Sheet  Patient will be seen for follow-up as needed.

## 2019-06-25 ENCOUNTER — Other Ambulatory Visit: Payer: Self-pay

## 2019-06-25 ENCOUNTER — Ambulatory Visit (INDEPENDENT_AMBULATORY_CARE_PROVIDER_SITE_OTHER): Payer: Self-pay | Admitting: Family Medicine

## 2019-06-25 VITALS — BP 111/70 | HR 83

## 2019-06-25 DIAGNOSIS — Z3A31 31 weeks gestation of pregnancy: Secondary | ICD-10-CM

## 2019-06-25 DIAGNOSIS — O09522 Supervision of elderly multigravida, second trimester: Secondary | ICD-10-CM

## 2019-06-25 DIAGNOSIS — O403XX0 Polyhydramnios, third trimester, not applicable or unspecified: Secondary | ICD-10-CM

## 2019-06-25 DIAGNOSIS — O09523 Supervision of elderly multigravida, third trimester: Secondary | ICD-10-CM

## 2019-06-25 DIAGNOSIS — Z3480 Encounter for supervision of other normal pregnancy, unspecified trimester: Secondary | ICD-10-CM

## 2019-06-25 DIAGNOSIS — O24410 Gestational diabetes mellitus in pregnancy, diet controlled: Secondary | ICD-10-CM

## 2019-06-25 NOTE — Progress Notes (Signed)
   TELEHEALTH VIRTUAL OBSTETRICS VISIT ENCOUNTER NOTE  I connected with Olivia Raymond on 06/25/19 at 10:10 AM EDT by telephone at home and verified that I am speaking with the correct person using two identifiers.   I discussed the limitations, risks, security and privacy concerns of performing an evaluation and management service by telephone and the availability of in person appointments. I also discussed with the patient that there may be a patient responsible charge related to this service. The patient expressed understanding and agreed to proceed.  Subjective:  Olivia Raymond is a 43 y.o. S0Y3016 at 30w4dbeing followed for ongoing prenatal care.  She is currently monitored for the following issues for this high-risk pregnancy and has Supervision of other normal pregnancy, antepartum; AMA (advanced maternal age) multigravida 320+ Language barrier; Polyhydramnios; LGA (large for gestational age) fetus affecting management of mother; and Gestational diabetes on their problem list.  Patient reports no complaints. Reports fetal movement. Denies any contractions, bleeding or leaking of fluid.   The following portions of the patient's history were reviewed and updated as appropriate: allergies, current medications, past family history, past medical history, past social history, past surgical history and problem list.   Objective:   General:  Alert, oriented and cooperative.   Mental Status: Normal mood and affect perceived. Normal judgment and thought content.   Vitals:   06/25/19 1017  BP: 111/70  Pulse: 83   Rest of physical exam deferred due to type of encounter  Assessment and Plan:  Pregnancy: [id number] 370w4daria was seen today for routine prenatal visit.  Diagnoses and all orders for this visit:  Supervision of high risk pregnancy, antepartum - GDMA1; next USKoreat 3660 not yet ordered as patient has upcoming USKorean 9/22 and one may be ordered  thereafter for polyhydramnios f/u regardless - RTC in 2 weeks; patient prefers in person visit next time   Polyhydramnios in third trimester complication, single or unspecified fetus - f/u USKorea/22  Diet controlled gestational diabetes mellitus (GDM) in third trimester - met with nutritionist 9/15 - monitoring 4x daily - fasting sugars all in 80's - 2 hour all < 120 - IOL at 40w if not delivered   Multigravida of advanced maternal age in second trimester - low risk NIPS   Preterm labor symptoms and general obstetric precautions including but not limited to vaginal bleeding, contractions, leaking of fluid and fetal movement were reviewed in detail with the patient.  I discussed the assessment and treatment plan with the patient. The patient was provided an opportunity to ask questions and all were answered. The patient agreed with the plan and demonstrated an understanding of the instructions. The patient was advised to call back or seek an in-person office evaluation/[facility] or concerning [facility]. Please refer to After Visit Summary for other counseling recommendations.   I provided 15 minutes of non-face-to-face time during this encounter.  Return in about 2 weeks (around 07/09/2019) for ROB; patient prefers in-person.  Future Appointments  Date Time Provider DeArgyle9/22/2020  1:15 PM WH[place]HG[place]FC-US  06/29/2019  1:15 PM WHRobinhood[place] [facility]

## 2019-06-25 NOTE — Progress Notes (Signed)
I connected with  Olivia Raymond on 06/25/19 by a video enabled telemedicine application and verified that I am speaking with the correct person using two identifiers.  Televisit ROB with [employer] # [phone] BS readings today 82's  Yesterday 86/112/90

## 2019-06-29 ENCOUNTER — Other Ambulatory Visit (HOSPITAL_COMMUNITY): Payer: Self-pay | Admitting: Maternal & Fetal Medicine

## 2019-06-29 ENCOUNTER — Encounter (HOSPITAL_COMMUNITY): Payer: Self-pay | Admitting: *Deleted

## 2019-06-29 ENCOUNTER — Ambulatory Visit (HOSPITAL_COMMUNITY): Payer: Self-pay | Admitting: *Deleted

## 2019-06-29 ENCOUNTER — Ambulatory Visit (HOSPITAL_COMMUNITY)
Admission: RE | Admit: 2019-06-29 | Discharge: 2019-06-29 | Disposition: A | Discharge status: Home / Self Care | Payer: Self-pay | Source: Ambulatory Visit | Attending: Obstetrics and Gynecology | Admitting: Obstetrics and Gynecology

## 2019-06-29 ENCOUNTER — Other Ambulatory Visit (HOSPITAL_COMMUNITY): Payer: Self-pay | Admitting: *Deleted

## 2019-06-29 ENCOUNTER — Other Ambulatory Visit: Payer: Self-pay

## 2019-06-29 DIAGNOSIS — O3663X0 Maternal care for excessive fetal growth, third trimester, not applicable or unspecified: Secondary | ICD-10-CM

## 2019-06-29 DIAGNOSIS — O24410 Gestational diabetes mellitus in pregnancy, diet controlled: Secondary | ICD-10-CM

## 2019-06-29 DIAGNOSIS — O403XX0 Polyhydramnios, third trimester, not applicable or unspecified: Secondary | ICD-10-CM | POA: Insufficient documentation

## 2019-06-29 DIAGNOSIS — O09523 Supervision of elderly multigravida, third trimester: Secondary | ICD-10-CM

## 2019-06-29 DIAGNOSIS — O99213 Obesity complicating pregnancy, third trimester: Secondary | ICD-10-CM

## 2019-06-29 DIAGNOSIS — Z362 Encounter for other antenatal screening follow-up: Secondary | ICD-10-CM

## 2019-06-29 DIAGNOSIS — Z3A32 32 weeks gestation of pregnancy: Secondary | ICD-10-CM

## 2019-07-16 ENCOUNTER — Encounter: Payer: Self-pay | Admitting: Certified Nurse Midwife

## 2019-07-16 ENCOUNTER — Other Ambulatory Visit: Payer: Self-pay

## 2019-07-16 ENCOUNTER — Ambulatory Visit (INDEPENDENT_AMBULATORY_CARE_PROVIDER_SITE_OTHER): Payer: Self-pay | Admitting: Certified Nurse Midwife

## 2019-07-16 VITALS — BP 119/79 | HR 81 | Wt 192.0 lb

## 2019-07-16 DIAGNOSIS — Z3480 Encounter for supervision of other normal pregnancy, unspecified trimester: Secondary | ICD-10-CM

## 2019-07-16 DIAGNOSIS — O24410 Gestational diabetes mellitus in pregnancy, diet controlled: Secondary | ICD-10-CM

## 2019-07-16 DIAGNOSIS — O09523 Supervision of elderly multigravida, third trimester: Secondary | ICD-10-CM

## 2019-07-16 DIAGNOSIS — Z603 Acculturation difficulty: Secondary | ICD-10-CM

## 2019-07-16 DIAGNOSIS — Z789 Other specified health status: Secondary | ICD-10-CM

## 2019-07-16 DIAGNOSIS — Z3A34 34 weeks gestation of pregnancy: Secondary | ICD-10-CM

## 2019-07-16 NOTE — Patient Instructions (Signed)
Tercer trimestre de embarazo Third Trimester of Pregnancy El tercer trimestre comprende desde la semana28 hasta la semana40 (desde el mes7 hasta el mes9). El tercer trimestre es un perodo en el que el beb en gestacin (feto) crece rpidamente. Hacia el final del noveno mes, el feto mide alrededor de 20pulgadas (45cm) de largo y pesa entre 6 y 10 libras (2,700 y 4,500kg). Cambios en el cuerpo durante el tercer trimestre Su organismo continuar atravesando por muchos cambios durante el embarazo. Estos cambios varan de una mujer a otra. Durante el tercer trimestre:  Seguir aumentando de peso. Es de esperar que aumente entre 25 y 35libras (11 y 16kg) hacia el final del embarazo.  Podrn aparecer las primeras estras en las caderas, el abdomen y las mamas.  Puede tener necesidad de orinar con ms frecuencia porque el feto baja hacia la pelvis y ejerce presin sobre la vejiga.  Puede desarrollar o continuar teniendo acidez estomacal. Esto se debe a que el aumento de las hormonas hace que los msculos en el tubo digestivo trabajen ms lentamente.  Puede desarrollar o continuar teniendo estreimiento debido a que el aumento de las hormonas ralentiza la digestin y hace que los msculos que empujan los desechos a travs de los intestinos se relajen.  Puede desarrollar hemorroides. Estas son venas hinchadas (venas varicosas) en el recto que pueden causar picazn o dolor.  Puede desarrollar venas hinchadas y abultadas (venas varicosas) en las piernas.  Puede presentar ms dolor en la pelvis, la espalda o los muslos. Esto se debe al aumento de peso y al aumento de las hormonas que relajan las articulaciones.  Tal vez haya cambios en el cabello. Esto cambios pueden incluir su engrosamiento, crecimiento rpido y cambios en la textura. Adems, a algunas mujeres se les cae el cabello durante o despus del embarazo, o tienen el cabello seco o fino. Lo ms probable es que el cabello se le normalice  despus del nacimiento del beb.  Sus pechos seguirn creciendo y se pondrn cada vez ms sensibles. Un lquido amarillo (calostro) puede salir de sus pechos. Esta es la primera leche que usted produce para su beb.  El ombligo puede salir hacia afuera.  Puede observar que se le hinchan las manos, el rostro o los tobillos.  Puede presentar un aumento del hormigueo o entumecimiento en las manos, brazos y piernas. La piel de su vientre tambin puede sentirse entumecida.  Puede sentir que le falta el aire debido a que se expande el tero.  Puede tener ms problemas para dormir. Esto puede deberse al tamao de su vientre, una mayor necesidad de orinar y un aumento en el metabolismo de su cuerpo.  Puede notar que el feto "baja" o lo siente ms bajo, en el abdomen (aligeramiento).  Puede tener un aumento de la secrecin vaginal.  Puede notar que las articulaciones se sienten flojas y puede sentir dolor alrededor del hueso plvico. Qu debe esperar en las visitas prenatales Le harn exmenes prenatales cada 2semanas hasta la semana36. A partir de ese momento le harn los exmenes semanales. Durante una visita prenatal de rutina:  La pesarn para asegurarse de que usted y el beb estn creciendo normalmente.  Le tomarn la presin arterial.  Le medirn el abdomen para controlar el desarrollo del beb.  Se escucharn los latidos cardacos fetales.  Se evaluarn los resultados de los estudios solicitados en visitas anteriores.  Le revisarn el cuello del tero cuando est prxima la fecha de parto para controlar si el cuello uterino   se ha afinado o adelgazado (borrado).  Le harn una prueba de estreptococos del grupo B. Esto sucede entre las semanas 35 y 37. El mdico puede preguntarle lo siguiente:  Cmo le gustara que fuera el parto.  Cmo se siente.  Si siente los movimientos del beb.  Si ha tenido sntomas anormales, como prdida de lquido, sangrado, dolores de cabeza  intensos o clicos abdominales.  Si est consumiendo algn producto que contenga tabaco, como cigarrillos, tabaco de mascar y cigarrillos electrnicos.  Si tiene alguna pregunta. Otros exmenes o estudios de deteccin que pueden realizarse durante el tercer trimestre incluyen lo siguiente:  Anlisis de sangre para controlar los niveles de hierro (anemia).  Controles fetales para determinar su salud, nivel de actividad y crecimiento. Si tiene alguna enfermedad o hay problemas durante el embarazo, le harn estudios.  Prueba sin estrs. Esta prueba verifica la salud de su beb y se utiliza para detectar signos de problemas, tales como si el beb no est recibiendo suficiente oxgeno. Durante esta prueba, se coloca un cinturn alrededor de su vientre. Al moverse el beb, se controla su frecuencia cardaca. Qu es el falso trabajo de parto? El falso trabajo de parto es una afeccin en la que se sienten pequeos e irregulares espasmos de los msculos del tero (contracciones) que generalmente desaparecen al hacer reposo, cambiar de posicin o al beber agua. Estas contracciones se llaman contracciones de Braxton Hicks. Las contracciones pueden durar horas, das o incluso semanas, antes de que el verdadero trabajo de parto se inicie. Si las contracciones ocurren a intervalos regulares, se vuelven ms frecuentes, aumentan en intensidad o se vuelven dolorosas, debera ver al mdico.  Cules son los signos del trabajo de parto?  Clicos abdominales.  Contracciones regulares que comienzan en intervalos de 10 minutos y se vuelven ms fuertes y ms frecuentes con el tiempo.  Contracciones que comienzan en la parte superior del tero y se extienden hacia abajo, a la zona inferior del abdomen y la espalda.  Aumento de la presin en la pelvis y dolor latente en la espalda.  Una secrecin de mucosidad acuosa o con sangre que sale de la vagina.  Prdida de lquido amnitico. Esto tambin se conoce como  "ruptura de la bolsa de las aguas". Esto puede ser un chorro o un goteo constante y lento de lquido. Informe a su mdico si tiene un color u olor extrao. Si tiene alguno de estos signos, llame a su mdico de inmediato, incluso si es antes de la fecha de parto. Siga estas indicaciones en su casa: Medicamentos  Siga las indicaciones del mdico en relacin con el uso de medicamentos. Durante el embarazo, hay medicamentos que pueden tomarse y otros que no.  Tome vitaminas prenatales que contengan por lo menos 600microgramos (?g) de cido flico.  Si est estreida, tome un laxante suave, si el mdico lo autoriza. Qu debe comer y beber   Lleve una dieta equilibrada que incluya gran cantidad de frutas y verduras frescas, cereales integrales, buenas fuentes de protenas como carnes magras, huevos o tofu, y lcteos descremados. El mdico la ayudar a determinar la cantidad de peso que puede aumentar.  No coma carne cruda ni quesos sin cocinar. Estos elementos contienen grmenes que pueden causar defectos congnitos en el beb.  Si no consume muchos alimentos con calcio, hable con su mdico sobre si debera tomar un suplemento diario de calcio.  La ingesta diaria de cuatro o cinco comidas pequeas en lugar de tres comidas abundantes.  Limite el   consumo de alimentos con alto contenido de grasas y azcares procesados, como alimentos fritos o dulces.  Para evitar el estreimiento: ? Bebe suficiente lquido para mantener la orina clara o de color amarillo plido. ? Consuma alimentos ricos en fibra, como frutas y verduras frescas, cereales integrales y frijoles. Actividad  Haga ejercicio solamente como se lo haya indicado el mdico. La mayora de las mujeres pueden continuar su rutina de ejercicios durante el embarazo. Intente realizar como mnimo 30minutos de actividad fsica por lo menos 5das a la semana. Deje de hacer ejercicio si experimenta contracciones uterinas.  Evite levantar pesos  excesivos.  No haga ejercicio en condiciones de calor o humedad extremas, o a grandes alturas.  Use zapatos cmodos de tacn bajo.  Adopte una buena postura.  Puede seguir teniendo relaciones sexuales, excepto que el mdico le diga lo contrario. Alivio del dolor y del malestar  Haga pausas frecuentes y descanse con las piernas elevadas si tiene calambres en las piernas o dolor en la zona lumbar.  Dese baos de asiento con agua tibia para aliviar el dolor o las molestias causadas por las hemorroides. Use una crema para las hemorroides si el mdico la autoriza.  Use un sostn que le brinde buen soporte para prevenir las molestias causadas por la sensibilidad en los pechos.  Si tiene venas varicosas: ? Use pantimedias que brinden soporte o medias de compresin como se lo haya indicado el mdico. ? Eleve los pies durante 15minutos, 3 o 4veces por da. Cuidados prenatales  Escriba sus preguntas. Llvelas cuando concurra a las visitas prenatales.  Concurra a todas las visitas prenatales tal como se lo haya indicado el mdico. Esto es importante. Seguridad  Use el cinturn de seguridad en todo momento mientras conduce.  Haga una lista de los nmeros de telfono de emergencia, que incluya los nmeros de telfono de familiares, amigos, el hospital y los departamentos de polica y bomberos. Instrucciones generales  Evite el contacto con las bandejas sanitarias de los gatos y la tierra que estos animales usan. Estos elementos contienen grmenes que pueden causar defectos congnitos en el beb. Si tiene un gato, pdale a alguien que limpie la caja de arena por usted.  No haga viajes largos excepto que sea absolutamente necesario y solo con la autorizacin de su mdico.  No se d baos de inmersin en agua caliente, baos turcos ni saunas.  No beber alcohol.  No consuma ningn producto que contenga nicotina o tabaco, como cigarrillos y cigarrillos electrnicos. Si necesita ayuda para  dejar de fumar, consulte al mdico.  No use hierbas medicinales ni medicamentos que no le hayan recetado. Estas sustancias qumicas afectan la formacin y el desarrollo del beb.  No se haga duchas vaginales ni use tampones o toallas higinicas perfumadas.  No mantenga las piernas cruzadas durante largos periodos de tiempo.  Para prepararse para la llegada de su beb: ? Tome clases prenatales para entender, practicar, y hacer preguntas sobre el trabajo de parto y el parto. ? Haga un ensayo de la partida al hospital. ? Visite el hospital y recorra el rea de maternidad. ? Pida un permiso de maternidad o paternidad a sus empleadores. ? Organice para que algn familiar o amigo cuide a sus mascotas mientras usted est en el hospital. ? Compre un asiento de seguridad orientado hacia atrs, y asegrese de saber cmo instalarlo en su automvil. ? Prepare el bolso que llevar al hospital. ? Prepare la habitacin del beb. Asegrese de quitar todas las almohadas y animales   de peluche de la cuna del beb para evitar la asfixia.  Visite a su dentista si no lo ha Quarry manager. Use un cepillo de dientes blando para higienizarse los dientes y psese el hilo dental con suavidad. Comunquese con un mdico si:  No est segura de que est en trabajo de parto o de que ha roto la bolsa de las aguas.  Se siente mareada.  Siente clicos leves, presin en la pelvis o dolor persistente en el abdomen.  Siente dolor en la parte inferior de la espalda.  Tiene nuseas, vmitos o diarrea persistentes.  Margette Fast secrecin vaginal inusual o con mal olor.  Siente dolor al [employer]. Solicite ayuda de inmediato si:  Rompe la bolsa de las aguas antes de la semana 37.  Tiene contracciones regulares en intervalos de menos de 5 minutos antes de la semana 56.  Tiene fiebre.  Tiene una prdida de lquido por la vagina.  Tiene sangrado o pequeas prdidas vaginales.  Tiene dolor o clicos  abdominales intensos.  Baja de peso o sube de peso rpidamente.  Tiene dificultad para respirar y siente dolor de pecho.  Sbitamente se le hinchan mucho el rostro, las [place], los tobillos, los pies o las piernas.  Su beb se mueve menos de 10 veces en 2 horas.  Siente un dolor de cabeza intenso que no se alivia al tomar [employer].  Nota cambios en la visin. Resumen  El tercer trimestre comprende desde la [id number] [facility] [id number], es decir, desde el mes7 hasta el mes9. El tercer trimestre es un perodo en el que el beb en gestacin (feto) crece rpidamente.  Durante el tercer trimestre, su incomodidad puede aumentar a medida que usted y su beb continan aumentando de [place]. Es posible que tenga dolor abdominal, en las piernas y en la [place] para dormir y [place] mayor necesidad de Garment/textile technologist.  Durante el tercer trimestre, sus pechos seguirn creciendo y se pondrn cada vez ms sensibles. Un lquido amarillo Public affairs consultant) puede salir de sus pechos. Esta es la primera leche que usted produce para su beb.  El falso trabajo de parto es una afeccin en la que se sienten pequeos e irregulares espasmos de los msculos del tero (contracciones) que a Programme researcher, broadcasting/film/video. Estas contracciones se llaman contracciones de [employer]. Las [employer] pueden durar horas, das o incluso semanas, antes de que el verdadero trabajo de parto se inicie.  Los signos del trabajo de parto pueden incluir: calambres abdominales; contracciones regulares que comienzan en intervalos de 10 minutos y se vuelven ms fuertes y ms frecuentes con el tiempo; una secrecin de mucosidad acuosa o con sangre que sale de la vagina; aumento de la presin en la pelvis y Social research officer, government latente en la espalda; y prdida de lquido amnitico. Esta informacin no tiene Marine scientist el consejo del mdico. Asegrese de hacerle al mdico cualquier pregunta que tenga. Document Released: 07/03/2005 Document Revised:  02/04/2017 Document Reviewed: 02/04/2017 Elsevier Patient Education  2020 [employer].

## 2019-07-16 NOTE — Progress Notes (Signed)
   PRENATAL VISIT NOTE  Subjective:  Olivia Raymond is a 43 y.o. G6P5005 at [redacted]w[redacted]d being seen today for ongoing prenatal care.  She is currently monitored for the following issues for this high-risk pregnancy and has Supervision of other normal pregnancy, antepartum; AMA (advanced maternal age) multigravida 39+; Language barrier; Polyhydramnios; LGA (large for gestational age) fetus affecting management of mother; and Gestational diabetes on their problem list.  Patient reports no complaints.  Contractions: Not present. Vag. Bleeding: None.  Movement: Present. Denies leaking of fluid.   The following portions of the patient's history were reviewed and updated as appropriate: allergies, current medications, past family history, past medical history, past social history, past surgical history and problem list.   Objective:   Vitals:   07/16/19 1048  BP: 119/79  Pulse: 81  Weight: 192 lb (87.1 kg)    Fetal Status: Fetal Heart Rate (bpm): 153 Fundal Height: 42 cm Movement: Present     General:  Alert, oriented and cooperative. Patient is in no acute distress.  Skin: Skin is warm and dry. No rash noted.   Cardiovascular: Normal heart rate noted  Respiratory: Normal respiratory effort, no problems with respiration noted  Abdomen: Soft, gravid, appropriate for gestational age.  Pain/Pressure: Absent     Pelvic: Cervical exam deferred        Extremities: Normal range of motion.  Edema: None  Mental Status: Normal mood and affect. Normal behavior. Normal judgment and thought content.   Assessment and Plan:  Pregnancy: [id number] at [redacted]w[redacted]d 1. Supervision of other normal pregnancy, antepartum - Patient doing well, no complaints - Anticipatory guidance on upcoming appointments with antenatal screening starting at 36 weeks  - discussed obtaining GBS screening at next appointment  - [place] and BPP scheduled for 10/20  2. Language barrier - Spanish interpreter at bedside   3.  Multigravida of advanced maternal age in third trimester - Antenatal screening to begin at 12 weeks - weekly BPP or 2x weekly NST  - Delivery at 39 weeks   4. Diet controlled gestational diabetes mellitus (GDM) in third trimester - Reviewed CBG log - no abnormal fasting levels  - 5 out of 72 abnormal 2hr PP ranging from 120-137, patient reports going out to eat those days- stressed importance of diet modifications  - no medication needed with 6% of CBGs abnormal    Preterm labor symptoms and general obstetric precautions including but not limited to vaginal bleeding, contractions, leaking of fluid and fetal movement were reviewed in detail with the patient. Please refer to After Visit Summary for other counseling recommendations.   Return in about 2 weeks (around 07/30/2019) for ROB-in person.  Future Appointments  Date Time Provider [place]  07/27/2019  2:15 PM [facility]URSE [facility]  07/27/2019  2:15 PM [place] 4 WH-M[facility]US [facility]-US  07/30/2019 10:15 AM Lajean Manes, [place] None    Lajean Manes, CNM

## 2019-07-16 NOTE — Progress Notes (Signed)
Patient reports fetal movement, denies pain. 

## 2019-07-27 ENCOUNTER — Encounter (HOSPITAL_COMMUNITY): Payer: Self-pay

## 2019-07-27 ENCOUNTER — Ambulatory Visit (HOSPITAL_COMMUNITY): Payer: Self-pay

## 2019-07-27 ENCOUNTER — Other Ambulatory Visit: Payer: Self-pay

## 2019-07-27 ENCOUNTER — Encounter (HOSPITAL_COMMUNITY): Payer: Self-pay | Admitting: *Deleted

## 2019-07-27 ENCOUNTER — Ambulatory Visit (HOSPITAL_COMMUNITY)
Admission: RE | Admit: 2019-07-27 | Discharge: 2019-07-27 | Disposition: A | Discharge status: Home / Self Care | Payer: Self-pay | Source: Ambulatory Visit | Attending: Obstetrics | Admitting: Obstetrics

## 2019-07-27 ENCOUNTER — Ambulatory Visit (HOSPITAL_COMMUNITY): Payer: Self-pay | Admitting: *Deleted

## 2019-07-27 DIAGNOSIS — O403XX0 Polyhydramnios, third trimester, not applicable or unspecified: Secondary | ICD-10-CM

## 2019-07-27 DIAGNOSIS — O3663X0 Maternal care for excessive fetal growth, third trimester, not applicable or unspecified: Secondary | ICD-10-CM | POA: Diagnosis present

## 2019-07-27 DIAGNOSIS — O24410 Gestational diabetes mellitus in pregnancy, diet controlled: Secondary | ICD-10-CM | POA: Insufficient documentation

## 2019-07-27 DIAGNOSIS — Z362 Encounter for other antenatal screening follow-up: Secondary | ICD-10-CM

## 2019-07-27 DIAGNOSIS — Z3A36 36 weeks gestation of pregnancy: Secondary | ICD-10-CM

## 2019-07-27 DIAGNOSIS — O09523 Supervision of elderly multigravida, third trimester: Secondary | ICD-10-CM

## 2019-07-27 DIAGNOSIS — O99213 Obesity complicating pregnancy, third trimester: Secondary | ICD-10-CM

## 2019-07-30 ENCOUNTER — Other Ambulatory Visit (HOSPITAL_COMMUNITY): Payer: Self-pay | Admitting: *Deleted

## 2019-07-30 ENCOUNTER — Ambulatory Visit (INDEPENDENT_AMBULATORY_CARE_PROVIDER_SITE_OTHER): Payer: Medicaid Other | Admitting: Certified Nurse Midwife

## 2019-07-30 ENCOUNTER — Other Ambulatory Visit: Payer: Self-pay

## 2019-07-30 ENCOUNTER — Encounter: Payer: Self-pay | Admitting: Certified Nurse Midwife

## 2019-07-30 VITALS — BP 114/79 | HR 81 | Wt 192.0 lb

## 2019-07-30 DIAGNOSIS — O09523 Supervision of elderly multigravida, third trimester: Secondary | ICD-10-CM

## 2019-07-30 DIAGNOSIS — O403XX0 Polyhydramnios, third trimester, not applicable or unspecified: Secondary | ICD-10-CM

## 2019-07-30 DIAGNOSIS — Z113 Encounter for screening for infections with a predominantly sexual mode of transmission: Secondary | ICD-10-CM | POA: Diagnosis not present

## 2019-07-30 DIAGNOSIS — Z789 Other specified health status: Secondary | ICD-10-CM

## 2019-07-30 DIAGNOSIS — O09529 Supervision of elderly multigravida, unspecified trimester: Secondary | ICD-10-CM

## 2019-07-30 DIAGNOSIS — Z3480 Encounter for supervision of other normal pregnancy, unspecified trimester: Secondary | ICD-10-CM

## 2019-07-30 DIAGNOSIS — Z3A36 36 weeks gestation of pregnancy: Secondary | ICD-10-CM

## 2019-07-30 NOTE — Progress Notes (Addendum)
PRENATAL VISIT NOTE  Subjective:  Olivia Raymond is a 43 y.o. G6P5005 at [redacted]w[redacted]d being seen today for ongoing prenatal care.  She is currently monitored for the following issues for this high-risk pregnancy and has Supervision of other normal pregnancy, antepartum; AMA (advanced maternal age) multigravida 87+; Language barrier; Polyhydramnios; LGA (large for gestational age) fetus affecting management of mother; and Gestational diabetes on their problem list.  Patient reports no bleeding, no contractions and no cramping.  Contractions: Not present. Vag. Bleeding: None.  Movement: Present. Denies leaking of fluid.   The following portions of the patient's history were reviewed and updated as appropriate: allergies, current medications, past family history, past medical history, past social history, past surgical history and problem list.   Objective:   Vitals:   07/30/19 1025  BP: 114/79  Pulse: 81  Weight: 192 lb (87.1 kg)    Fetal Status: Fetal Heart Rate (bpm): 151 Fundal Height: 45 cm Movement: Present  Presentation: Vertex  General:  Alert, oriented and cooperative. Patient is in no acute distress.  Skin: Skin is warm and dry. No rash noted.   Cardiovascular: Normal heart rate noted  Respiratory: Normal respiratory effort, no problems with respiration noted  Abdomen: Soft, gravid, large for gestational age (45cm).  Pain/Pressure: Absent     Pelvic: Cervical exam performed,Dilation: 1 Effacement (%): 40 Station: -3  Extremities: Normal range of motion.  Edema: None  Mental Status: Normal mood and affect. Normal behavior. Normal judgment and thought content.   Assessment and Plan:  Pregnancy: [id number] at [redacted]w[redacted]d 1. Supervision of other normal pregnancy, antepartum Oliviya is progressing well and has had good at-home sugars. She is being monitored closely due to LGA infant, gestational diabetes, polyhydramnios, and advanced maternal age. Due to her cost concerns we,  recommend coming into our office 2x weekly for fetal monitoring rather than routine weekly U/S and BPPs. Early delivery at 39 weeks is recommended.  - Strep Gp B NAA - GC/Chlamydia probe amp ([place])not at [facility]  2. Language barrier Video interpreter was used throughout our conversation. She was provided opportunities to ask questions and voice concerns using the interpreter. All physical exams were completed with interpreter services without video for patient privacy.  57. Multigravida of advanced maternal age in third trimester Frequent monitoring as above  4. Polyhydramnios in third trimester complication, single or unspecified fetus Frequent monitoring as above  Term labor symptoms and general obstetric precautions including but not limited to vaginal bleeding, contractions, leaking of fluid and fetal movement were reviewed in detail with the patient. Please refer to After Visit Summary for other counseling recommendations.   Return in about 1 week (around 08/06/2019) for in person- HROB.  Future Appointments  Date Time Provider [place]  08/04/2019  2:40 PM [place] NURSE [place] [facility]  08/04/2019  2:45 PM [place] 2 WH-MFCUS M[facility]-US  08/06/2019  9:45 AM Clarnce Flock, MD [facility] None  08/12/2019  9:15 AM Chancy Milroy, MD [place] None  08/19/2019 11:00 AM Constant, Vickii Chafe, MD [place] None    Kathryne Eriksson, Medical Student   I confirm that I have verified the information documented in the medical student's note and that I have also personally reperformed the history, physical exam and all medical decision making activities of this service and have verified that all service and findings are accurately documented in this student's note.   Reviewed CBGs with patient- copy of record scanned into media  - Fasting range from  71-89, no abnormal range of fasting since last appointment on 07/16/2019 - 2hr PP range from 66-104 with one blood sugar being 120, no abnormal  over 120  - no need for medication initiation at this time, plan for IOL at 39 weeks   FLU and TDAP completed at [facility] on 10/22- copy of record scanned under media   Educated and discussed plan of care with patient. Patient is concerned with follow up US and cost- given option of 2x weekly NST in office vs weekly BPP. Patient has applied for financial assistance and BPP cost could be covered while NST she would have to pay $75 out of pocket each time. Patient agrees to weekly BPP   High risk pregnancy complicated by A1GDM, AMA, polyhydramnios and LGA- plan for delivery at 39 weeks.   Sharyon Cable, CNM 07/30/2019 11:24 AM

## 2019-07-30 NOTE — Progress Notes (Signed)
ROB  GBS due today.  CC: None  Pt has had Flu and Tdap done at [facility]  Pt has blood sugar logs

## 2019-08-01 LAB — STREP GP B NAA: Strep Gp B NAA: NEGATIVE

## 2019-08-02 ENCOUNTER — Encounter: Payer: Self-pay | Admitting: *Deleted

## 2019-08-03 LAB — GC/CHLAMYDIA PROBE AMP (~~LOC~~) NOT AT ARMC
Chlamydia: NEGATIVE
Comment: NEGATIVE
Comment: NORMAL
Neisseria Gonorrhea: NEGATIVE

## 2019-08-04 ENCOUNTER — Ambulatory Visit (HOSPITAL_COMMUNITY)
Admission: RE | Admit: 2019-08-04 | Discharge: 2019-08-04 | Disposition: A | Discharge status: Home / Self Care | Payer: Medicaid Other | Source: Ambulatory Visit | Attending: Maternal & Fetal Medicine | Admitting: Maternal & Fetal Medicine

## 2019-08-04 ENCOUNTER — Other Ambulatory Visit (HOSPITAL_COMMUNITY): Payer: Self-pay | Admitting: *Deleted

## 2019-08-04 ENCOUNTER — Ambulatory Visit (HOSPITAL_COMMUNITY): Payer: Medicaid Other | Admitting: *Deleted

## 2019-08-04 ENCOUNTER — Other Ambulatory Visit: Payer: Self-pay

## 2019-08-04 ENCOUNTER — Encounter (HOSPITAL_COMMUNITY): Payer: Self-pay | Admitting: *Deleted

## 2019-08-04 DIAGNOSIS — O24410 Gestational diabetes mellitus in pregnancy, diet controlled: Secondary | ICD-10-CM | POA: Diagnosis present

## 2019-08-04 DIAGNOSIS — O3663X0 Maternal care for excessive fetal growth, third trimester, not applicable or unspecified: Secondary | ICD-10-CM | POA: Diagnosis present

## 2019-08-04 DIAGNOSIS — O09529 Supervision of elderly multigravida, unspecified trimester: Secondary | ICD-10-CM | POA: Insufficient documentation

## 2019-08-04 DIAGNOSIS — O99213 Obesity complicating pregnancy, third trimester: Secondary | ICD-10-CM

## 2019-08-04 DIAGNOSIS — O09523 Supervision of elderly multigravida, third trimester: Secondary | ICD-10-CM

## 2019-08-04 DIAGNOSIS — O403XX0 Polyhydramnios, third trimester, not applicable or unspecified: Secondary | ICD-10-CM

## 2019-08-04 DIAGNOSIS — Z3A37 37 weeks gestation of pregnancy: Secondary | ICD-10-CM

## 2019-08-04 NOTE — Progress Notes (Signed)
Olivia Raymond present as interpreter. 

## 2019-08-06 ENCOUNTER — Encounter (HOSPITAL_COMMUNITY): Payer: Self-pay

## 2019-08-06 ENCOUNTER — Other Ambulatory Visit: Payer: Self-pay

## 2019-08-06 ENCOUNTER — Encounter: Payer: Self-pay | Admitting: Family Medicine

## 2019-08-06 ENCOUNTER — Inpatient Hospital Stay (HOSPITAL_COMMUNITY)
Admission: AD | Admit: 2019-08-06 | Discharge: 2019-08-08 | DRG: 807 | Disposition: A | Discharge status: Home / Self Care | Payer: Medicaid Other | Attending: Obstetrics and Gynecology | Admitting: Obstetrics and Gynecology

## 2019-08-06 ENCOUNTER — Ambulatory Visit (INDEPENDENT_AMBULATORY_CARE_PROVIDER_SITE_OTHER): Payer: Self-pay | Admitting: Family Medicine

## 2019-08-06 VITALS — BP 121/80 | HR 80 | Wt 194.0 lb

## 2019-08-06 DIAGNOSIS — Z8619 Personal history of other infectious and parasitic diseases: Secondary | ICD-10-CM

## 2019-08-06 DIAGNOSIS — Z758 Other problems related to medical facilities and other health care: Secondary | ICD-10-CM | POA: Diagnosis present

## 2019-08-06 DIAGNOSIS — O403XX0 Polyhydramnios, third trimester, not applicable or unspecified: Secondary | ICD-10-CM

## 2019-08-06 DIAGNOSIS — Z3480 Encounter for supervision of other normal pregnancy, unspecified trimester: Secondary | ICD-10-CM

## 2019-08-06 DIAGNOSIS — Z789 Other specified health status: Secondary | ICD-10-CM | POA: Diagnosis present

## 2019-08-06 DIAGNOSIS — O24420 Gestational diabetes mellitus in childbirth, diet controlled: Secondary | ICD-10-CM | POA: Diagnosis present

## 2019-08-06 DIAGNOSIS — O24410 Gestational diabetes mellitus in pregnancy, diet controlled: Secondary | ICD-10-CM

## 2019-08-06 DIAGNOSIS — O09529 Supervision of elderly multigravida, unspecified trimester: Secondary | ICD-10-CM

## 2019-08-06 DIAGNOSIS — O409XX0 Polyhydramnios, unspecified trimester, not applicable or unspecified: Secondary | ICD-10-CM | POA: Diagnosis present

## 2019-08-06 DIAGNOSIS — Z3A37 37 weeks gestation of pregnancy: Secondary | ICD-10-CM

## 2019-08-06 DIAGNOSIS — O43123 Velamentous insertion of umbilical cord, third trimester: Secondary | ICD-10-CM | POA: Diagnosis present

## 2019-08-06 DIAGNOSIS — O3663X0 Maternal care for excessive fetal growth, third trimester, not applicable or unspecified: Secondary | ICD-10-CM | POA: Diagnosis present

## 2019-08-06 DIAGNOSIS — O3660X0 Maternal care for excessive fetal growth, unspecified trimester, not applicable or unspecified: Secondary | ICD-10-CM | POA: Diagnosis present

## 2019-08-06 DIAGNOSIS — O09523 Supervision of elderly multigravida, third trimester: Secondary | ICD-10-CM

## 2019-08-06 DIAGNOSIS — O134 Gestational [pregnancy-induced] hypertension without significant proteinuria, complicating childbirth: Secondary | ICD-10-CM | POA: Diagnosis present

## 2019-08-06 DIAGNOSIS — O26893 Other specified pregnancy related conditions, third trimester: Secondary | ICD-10-CM | POA: Diagnosis present

## 2019-08-06 DIAGNOSIS — O24419 Gestational diabetes mellitus in pregnancy, unspecified control: Secondary | ICD-10-CM | POA: Diagnosis present

## 2019-08-06 LAB — TYPE AND SCREEN
ABO/RH(D): O POS
Antibody Screen: NEGATIVE

## 2019-08-06 LAB — CBC
HCT: 38.4 % (ref 36.0–46.0)
Hemoglobin: 13.2 g/dL (ref 12.0–15.0)
MCH: 28.8 pg (ref 26.0–34.0)
MCHC: 34.4 g/dL (ref 30.0–36.0)
MCV: 83.8 fL (ref 80.0–100.0)
Platelets: 219 10*3/uL (ref 150–400)
RBC: 4.58 MIL/uL (ref 3.87–5.11)
RDW: 14 % (ref 11.5–15.5)
WBC: 7.7 10*3/uL (ref 4.0–10.5)
nRBC: 0 % (ref 0.0–0.2)

## 2019-08-06 LAB — POCT FERN TEST

## 2019-08-06 LAB — ABO/RH: ABO/RH(D): O POS

## 2019-08-06 LAB — GLUCOSE, CAPILLARY: Glucose-Capillary: 77 mg/dL (ref 70–99)

## 2019-08-06 MED ORDER — SOD CITRATE-CITRIC ACID 500-334 MG/5ML PO SOLN: 30.0000 mL | ORAL | Status: DC | PRN

## 2019-08-06 MED ORDER — OXYTOCIN BOLUS FROM INFUSION
500.0000 mL | Freq: Once | INTRAVENOUS | Status: AC
  Administered 2019-08-06: 500 mL via INTRAVENOUS

## 2019-08-06 MED ORDER — ONDANSETRON HCL 4 MG/2ML IJ SOLN: 4.0000 mg | Freq: Four times a day (QID) | INTRAMUSCULAR | Status: DC | PRN

## 2019-08-06 MED ORDER — ACETAMINOPHEN 325 MG PO TABS: 650.0000 mg | ORAL_TABLET | ORAL | Status: DC | PRN

## 2019-08-06 MED ORDER — LACTATED RINGERS IV SOLN: 500.0000 mL | INTRAVENOUS | Status: DC | PRN

## 2019-08-06 MED ORDER — LACTATED RINGERS IV SOLN
INTRAVENOUS | Status: DC
  Administered 2019-08-06: 21:00:00 via INTRAVENOUS

## 2019-08-06 MED ORDER — OXYTOCIN 40 UNITS IN NORMAL SALINE INFUSION - SIMPLE MED
2.5000 [IU]/h | INTRAVENOUS | Status: DC
  Filled 2019-08-06: qty 1000

## 2019-08-06 MED ORDER — LIDOCAINE HCL (PF) 1 % IJ SOLN
30.0000 mL | INTRAMUSCULAR | Status: AC | PRN
  Administered 2019-08-06: 30 mL via SUBCUTANEOUS
  Filled 2019-08-06: qty 30

## 2019-08-06 NOTE — Progress Notes (Signed)
ROB  GBS Neg on 07/30/19 GC/CT Neg 07/30/19  CC: None  Pt consents to student being present in room.

## 2019-08-06 NOTE — MAU Note (Signed)
.  Olivia Raymond is a 43 y.o. at [redacted]w[redacted]d here in MAU reporting: possible rupture of membranes at 76. No VB, + fetal movement. Reports she does not feel any ctx.   Pain score: 0 FHT:140

## 2019-08-06 NOTE — Patient Instructions (Signed)
° °Lactancia materna °Breastfeeding ° °Decidir amamantar es una de las mejores elecciones que puede hacer por usted y su bebé. Un cambio en las hormonas durante el embarazo hace que las mamas produzcan leche materna en las glándulas productoras de leche. Las hormonas impiden que la leche materna sea liberada antes del nacimiento del bebé. Además, impulsan el flujo de leche luego del nacimiento. Una vez que ha comenzado a amamantar, pensar en el bebé, así como la succión o el llanto, pueden estimular la liberación de leche de las glándulas productoras de leche. °Los beneficios de amamantar °Las investigaciones demuestran que la lactancia materna ofrece muchos beneficios de salud para bebés y madres. Además, ofrece una forma gratuita y conveniente de alimentar al bebé. °Para el bebé °· La primera leche (calostro) ayuda a mejorar el funcionamiento del aparato digestivo del bebé. °· Las células especiales de la leche (anticuerpos) ayudan a combatir las infecciones en el bebé. °· Los bebés que se alimentan con leche materna también tienen menos probabilidades de tener asma, alergias, obesidad o diabetes de tipo 2. Además, tienen menor riesgo de sufrir el síndrome de muerte súbita del lactante (SMSL). °· Los nutrientes de la leche materna son mejores para satisfacer las necesidades del bebé en comparación con la leche maternizada. °· La leche materna mejora el desarrollo cerebral del bebé. °Para usted °· La lactancia materna favorece el desarrollo de un vínculo muy especial entre la madre y el bebé. °· Es conveniente. La leche materna es económica y siempre está disponible a la temperatura correcta. °· La lactancia materna ayuda a quemar calorías. Le ayuda a perder el peso ganado durante el embarazo. °· Hace que el útero vuelva al tamaño que tenía antes del embarazo más rápido. Además, disminuye el sangrado (loquios) después del parto. °· La lactancia materna contribuye a reducir el riesgo de tener diabetes de tipo 2,  osteoporosis, artritis reumatoide, enfermedades cardiovasculares y cáncer de mama, ovario, útero y endometrio en el futuro. °Información básica sobre la lactancia °Comienzo de la lactancia °· Encuentre un lugar cómodo para sentarse o acostarse, con un buen respaldo para el cuello y la espalda. °· Coloque una almohada o una manta enrollada debajo del bebé para acomodarlo a la altura de la mama (si está sentada). Las almohadas para amamantar se han diseñado especialmente a fin de servir de apoyo para los brazos y el bebé mientras amamanta. °· Asegúrese de que la barriga del bebé (abdomen) esté frente a la suya. °· Masajee suavemente la mama. Con las yemas de los dedos, masajee los bordes exteriores de la mama hacia adentro, en dirección al pezón. Esto estimula el flujo de leche. Si la leche fluye lentamente, es posible que deba continuar con este movimiento durante la lactancia. °· Sostenga la mama con 4 dedos por debajo y el pulgar por arriba del pezón (forme la letra “C” con la mano). Asegúrese de que los dedos se encuentren lejos del pezón y de la boca del bebé. °· Empuje suavemente los labios del bebé con el pezón o con el dedo. °· Cuando la boca del bebé se abra lo suficiente, acérquelo rápidamente a la mama e introduzca todo el pezón y la aréola, tanto como sea posible, dentro de la boca del bebé. La aréola es la zona de color que rodea al pezón. °? Debe haber más aréola visible por arriba del labio superior del bebé que por debajo del labio inferior. °? Los labios del bebé deben estar abiertos y extendidos hacia afuera (evertidos) para asegurar   que el bebé se prenda de forma adecuada y cómoda. °? La lengua del bebé debe estar entre la encía inferior y la mama. °· Asegúrese de que la boca del bebé esté en la posición correcta alrededor del pezón (prendido). Los labios del bebé deben crear un sello sobre la mama y estar doblados hacia afuera (invertidos). °· Es común que el bebé succione durante 2 a 3 minutos  para que comience el flujo de leche materna. °Cómo debe prenderse °Es muy importante que le enseñe al bebé cómo prenderse adecuadamente a la mama. Si el bebé no se prende adecuadamente, puede causar dolor en los pezones, reducir la producción de leche materna y hacer que el bebé tenga un escaso aumento de peso. Además, si el bebé no se prende adecuadamente al pezón, puede tragar aire durante la alimentación. Esto puede causarle molestias al bebé. Hacer eructar al bebé al cambiar de mama puede ayudarlo a liberar el aire. Sin embargo, enseñarle al bebé cómo prenderse a la mama adecuadamente es la mejor manera de evitar que se sienta molesto por tragar aire mientras se alimenta. °Signos de que el bebé se ha prendido adecuadamente al pezón °· Tironea o succiona de modo silencioso, sin causarle dolor. Los labios del bebé deben estar extendidos hacia afuera (evertidos). °· Se escucha que traga cada 3 o 4 succiones una vez que la leche ha comenzado a fluir (después de que se produzca el reflejo de eyección de la leche). °· Hay movimientos musculares por arriba y por delante de sus oídos al succionar. °Signos de que el bebé no se ha prendido adecuadamente al pezón °· Hace ruidos de succión o de chasquido mientras se alimenta. °· Siente dolor en los pezones. °Si cree que el bebé no se prendió correctamente, deslice el dedo en la comisura de la boca y colóquelo entre las encías del bebé para interrumpir la succión. Intente volver a comenzar a amamantar. °Signos de lactancia materna exitosa °Signos del bebé °· El bebé disminuirá gradualmente el número de succiones o dejará de succionar por completo. °· El bebé se quedará dormido. °· El cuerpo del bebé se relajará. °· El bebé retendrá una pequeña cantidad de leche en la boca. °· El bebé se desprenderá solo del pecho. °Signos que presenta usted °· Las mamas han aumentado la firmeza, el peso y el tamaño 1 a 3 horas después de amamantar. °· Están más blandas inmediatamente después  de amamantar. °· Se producen un aumento del volumen de leche y un cambio en su consistencia y color hacia el quinto día de lactancia. °· Los pezones no duelen, no están agrietados ni sangran. °Signos de que su bebé recibe la cantidad de leche suficiente °· Mojar por lo menos 1 o 2 pañales durante las primeras 24 horas después del nacimiento. °· Mojar por lo menos 5 o 6 pañales cada 24 horas durante la primera semana después del nacimiento. La orina debe ser clara o de color amarillo pálido a los 5 días de vida. °· Mojar entre 6 y 8 pañales cada 24 horas a medida que el bebé sigue creciendo y desarrollándose. °· Defeca por lo menos 3 veces en 24 horas a los 5 días de vida. Las heces deben ser blandas y amarillentas. °· Defeca por lo menos 3 veces en 24 horas a los 7 días de vida. Las heces deben ser grumosas y amarillentas. °· No registra una pérdida de peso mayor al 10 % del peso al nacer durante los primeros 3 días de vida. °· Aumenta de peso un promedio de 4   a 7 onzas (113 a 198 g) por semana después de los 4 días de vida. °· Aumenta de peso, diariamente, de manera uniforme a partir de los 5 días de vida, sin registrar pérdida de peso después de las 2 semanas de vida. °Después de alimentarse, es posible que el bebé regurgite una pequeña cantidad de leche. Esto es normal. °Frecuencia y duración de la lactancia °El amamantamiento frecuente la ayudará a producir más leche y puede prevenir dolores en los pezones y las mamas extremadamente llenas (congestión mamaria). Alimente al bebé cuando muestre signos de hambre o si siente la necesidad de reducir la congestión de las mamas. Esto se denomina "lactancia a demanda". Las señales de que el bebé tiene hambre incluyen las siguientes: °· Aumento del estado de alerta, actividad o inquietud. °· Mueve la cabeza de un lado a otro. °· Abre la boca cuando se le toca la mejilla o la comisura de la boca (reflejo de búsqueda). °· Aumenta las vocalizaciones, tales como sonidos de  succión, se relame los labios, emite arrullos, suspiros o chirridos. °· Mueve la mano hacia la boca y se chupa los dedos o las manos. °· Está molesto o llora. °Evite el uso del chupete en las primeras 4 a 6 semanas después del nacimiento del bebé. Después de este período, podrá usar un chupete. Las investigaciones demostraron que el uso del chupete durante el primer año de vida del bebé disminuye el riesgo de tener el síndrome de muerte súbita del lactante (SMSL). °Permita que el niño se alimente en cada mama todo lo que desee. Cuando el bebé se desprende o se queda dormido mientras se está alimentando de la primera mama, ofrézcale la segunda. Debido a que, con frecuencia, los recién nacidos están somnolientos las primeras semanas de vida, es posible que deba despertar al bebé para alimentarlo. °Los horarios de lactancia varían de un bebé a otro. Sin embargo, las siguientes reglas pueden servir como guía para ayudarla a garantizar que el bebé se alimenta adecuadamente: °· Se puede amamantar a los recién nacidos (bebés de 4 semanas o menos de vida) cada 1 a 3 horas. °· No deben transcurrir más de 3 horas durante el día o 5 horas durante la noche sin que se amamante a los recién nacidos. °· Debe amamantar al bebé un mínimo de 8 veces en un período de 24 horas. °Extracción de leche materna ° °  ° °La extracción y el almacenamiento de la leche materna le permiten asegurarse de que el bebé se alimente exclusivamente de su leche materna, aun en momentos en los que no puede amamantar. Esto tiene especial importancia si debe regresar al trabajo en el período en que aún está amamantando o si no puede estar presente en los momentos en que el bebé debe alimentarse. Su asesor en lactancia puede ayudarla a encontrar un método de extracción que funcione mejor para usted y orientarla sobre cuánto tiempo es seguro almacenar leche materna. °Cómo cuidar las mamas durante la lactancia °Los pezones pueden secarse, agrietarse y doler  durante la lactancia. Las siguientes recomendaciones pueden ayudarla a mantener las mamas humectadas y sanas: °· Evite usar jabón en los pezones. °· Use un sostén de soporte diseñado especialmente para la lactancia materna. Evite usar sostenes con aro o sostenes muy ajustados (sostenes deportivos). °· Seque al aire sus pezones durante 3 a 4 minutos después de amamantar al bebé. °· Utilice solo apósitos de algodón en el sostén para absorber las pérdidas de leche. La pérdida de un poco de leche materna entre   las tomas es normal. °· Utilice lanolina sobre los pezones luego de amamantar. La lanolina ayuda a mantener la humedad normal de la piel. La lanolina pura no es perjudicial (no es tóxica) para el bebé. Además, puede extraer manualmente algunas gotas de leche materna y masajear suavemente esa leche sobre los pezones para que la leche se seque al aire. °Durante las primeras semanas después del nacimiento, algunas mujeres experimentan congestión mamaria. La congestión mamaria puede hacer que sienta las mamas pesadas, calientes y sensibles al tacto. El pico de la congestión mamaria ocurre en el plazo de los 3 a 5 días después del parto. Las siguientes recomendaciones pueden ayudarla a aliviar la congestión mamaria: °· Vacíe por completo las mamas al amamantar o extraer leche. Puede aplicar calor húmedo en las mamas (en la ducha o con toallas húmedas para manos) antes de amamantar o extraer leche. Esto aumenta la circulación y ayuda a que la leche fluya. Si el bebé no vacía por completo las mamas cuando lo amamanta, extraiga la leche restante después de que haya finalizado. °· Aplique compresas de hielo sobre las mamas inmediatamente después de amamantar o extraer leche, a menos que le resulte demasiado incómodo. Haga lo siguiente: °? Ponga el hielo en una bolsa plástica. °? Coloque una toalla entre la piel y la bolsa de hielo. °? Coloque el hielo durante 20 minutos, 2 o 3 veces por día. °· Asegúrese de que el bebé  esté prendido y se encuentre en la posición correcta mientras lo alimenta. °Si la congestión mamaria persiste luego de 48 horas o después de seguir estas recomendaciones, comuníquese con su médico o un asesor en lactancia. °Recomendaciones de salud general durante la lactancia °· Consuma 3 comidas y 3 colaciones saludables todos los días. Las madres bien alimentadas que amamantan necesitan entre 450 y 500 calorías adicionales por día. Puede cumplir con este requisito al aumentar la cantidad de una dieta equilibrada que realice. °· Beba suficiente agua para mantener la orina clara o de color amarillo pálido. °· Descanse con frecuencia, relájese y siga tomando sus vitaminas prenatales para prevenir la fatiga, el estrés y los niveles bajos de vitaminas y minerales en el cuerpo (deficiencias de nutrientes). °· No consuma ningún producto que contenga nicotina o tabaco, como cigarrillos y cigarrillos electrónicos. El bebé puede verse afectado por las sustancias químicas de los cigarrillos que pasan a la leche materna y por la exposición al humo ambiental del tabaco. Si necesita ayuda para dejar de fumar, consulte al médico. °· Evite el consumo de alcohol. °· No consuma drogas ilegales o marihuana. °· Antes de usar cualquier medicamento, hable con el médico. Estos incluyen medicamentos recetados y de venta libre, como también vitaminas y suplementos a base de hierbas. Algunos medicamentos, que pueden ser perjudiciales para el bebé, pueden pasar a través de la leche materna. °· Puede quedar embarazada durante la lactancia. Si se desea un método anticonceptivo, consulte al médico sobre cuáles son las opciones seguras durante la lactancia. °Dónde encontrar más información: °Liga internacional [place]: www.llli.org. °Comuníquese con un médico si: °· Siente que quiere dejar de amamantar o se siente frustrada con la lactancia. °· Sus pezones están agrietados o sangran. °· Sus mamas están irritadas, sensibles o  calientes. °· Tiene los siguientes síntomas: °? Dolor en las mamas o en los pezones. °? Un área hinchada en cualquiera de las mamas. °? Fiebre o escalofríos. °? Náuseas o vómitos. °? Drenaje de otro líquido distinto de la leche materna desde los pezones. °· Sus mamas no   se llenan antes de amamantar al bebé para el quinto día después del parto. °· Se siente triste y deprimida. °· El bebé: °? Está demasiado somnoliento como para comer bien. °? Tiene problemas para dormir. °? Tiene más de 1 semana de vida y moja menos de 6 pañales en un periodo de 24 horas. °? No ha aumentado de peso a los 5 días de vida. °· El bebé defeca menos de 3 veces en 24 horas. °· La piel del bebé o las partes blancas de los ojos se vuelven amarillentas. °Solicite ayuda de inmediato si: °· El bebé está muy cansado (letargo) y no se quiere despertar para comer. °· Le sube la fiebre sin causa. °Resumen °· La lactancia materna ofrece muchos beneficios de salud para bebés y madres. °· Intente amamantar a su bebé cuando muestre signos tempranos de hambre. °· Haga cosquillas o empuje suavemente los labios del bebé con el dedo o el pezón para lograr que el bebé abra la boca. Acerque el bebé a la mama. Asegúrese de que la mayor parte de la aréola se encuentre dentro de la boca del bebé. Ofrézcale una mama y haga eructar al bebé antes de pasar a la otra. °· Hable con su médico o asesor en lactancia si tiene dudas o problemas con la lactancia. °Esta información no tiene como fin reemplazar el consejo del médico. Asegúrese de hacerle al médico cualquier pregunta que tenga. °Document Released: 09/23/2005 Document Revised: 12/18/2017 Document Reviewed: 01/13/2017 °Elsevier Patient Education © 2020 [employer] ° °

## 2019-08-06 NOTE — Progress Notes (Signed)
   Subjective:  Olivia Raymond is a 43 y.o. [id number] at [redacted]w[redacted]d being seen today for ongoing prenatal care.  She is currently monitored for the following issues for this high-risk pregnancy and has Supervision of other normal pregnancy, antepartum; AMA (advanced maternal age) multigravida 23+; Language barrier; Polyhydramnios; LGA (large for gestational age) fetus affecting management of mother; and Gestational diabetes on their problem list.  Patient reports no complaints.  Contractions: Not present. Vag. Bleeding: None.  Movement: Present. Denies leaking of fluid.   The following portions of the patient's history were reviewed and updated as appropriate: allergies, current medications, past family history, past medical history, past social history, past surgical history and problem list. Problem list updated.  Objective:   Vitals:   08/06/19 0949  BP: 121/80  Pulse: 80  Weight: 194 lb (88 kg)    Fetal Status: Fetal Heart Rate (bpm): 158   Movement: Present     General:  Alert, oriented and cooperative. Patient is in no acute distress.  Skin: Skin is warm and dry. No rash noted.   Cardiovascular: Normal heart rate noted  Respiratory: Normal respiratory effort, no problems with respiration noted  Abdomen: Soft, gravid, appropriate for gestational age. Pain/Pressure: Absent     Pelvic: Vag. Bleeding: None     Cervical exam deferred        Extremities: Normal range of motion.  Edema: None  Mental Status: Normal mood and affect. Normal behavior. Normal judgment and thought content.   Urinalysis:      Assessment and Plan:  Pregnancy: G6P5005 at [redacted]w[redacted]d  1. Supervision of other normal pregnancy, antepartum Doing well Reviewed normal GBS/GC/CT swabs Reviewed indication for IOL at 39wks for AMA/GDMA1, message sent to clinic scheduler to coordinate for 08/16/2019  2. Diet controlled gestational diabetes mellitus (GDM) in third trimester All sugars at goal on review  Borderline lunch time values, encouraged to moderate lunch time carbs  3. Multigravida of advanced maternal age in third trimester IOL at 39wks Normal NIPS  4. Language barrier Provider fluent in Spanish  5. Polyhydramnios in third trimester complication, single or unspecified fetus Resolved on most recent US  6. Excessive fetal growth affecting management of pregnancy in third trimester, single or unspecified fetus Most recent US on 10/20 showing EFW 3640g, 99%. Delivery summary from last delivery in 2012 reviewed, infant weight 4275 grams, does not report any problems at time of delivery.     Term labor symptoms and general obstetric precautions including but not limited to vaginal bleeding, contractions, leaking of fluid and fetal movement were reviewed in detail with the patient. Please refer to After Visit Summary for other counseling recommendations.  Return in 1 week (on 08/13/2019) for [facility], [facility] person.   Clarnce Flock, MD

## 2019-08-06 NOTE — Progress Notes (Signed)
LABOR PROGRESS NOTE  Keena Dinse Jesus Chandra is a 43 y.o. [id number] at [redacted]w[redacted]d  admitted for SOL after SROM.   Subjective: Patient is currently feeling strong contractions.  Contractions are getting closer together.  She denies wanting an epidural at this time but if labor progresses for a long time she may want one.   Objective: BP (!) 141/81   Pulse 87   Temp 98.3 F (36.8 C) (Oral)   Resp 18   Ht 5\' 4"  (1.626 m)   Wt 88 kg   LMP 11/16/2018   SpO2 98%   BMI 33.30 kg/m  or  Vitals:   08/06/19 2017 08/06/19 2151 08/06/19 2200 08/06/19 2250  BP: (!) 127/91 (!) 137/96  (!) 141/81  Pulse:  74  87  Resp: 18     Temp: 98.2 F (36.8 C) 98.3 F (36.8 C)    TempSrc: Oral Oral    SpO2:  99% 98%   Weight: 88 kg     Height: 5\' 4"  (1.626 m)       Dilation: 6.5 Effacement (%): 100 Cervical Position: Middle Station: -2 Presentation: Vertex Exam by:: Dr. Gifford Shave FHT: baseline rate 135, moderate varibility, + acel, no decel Toco: 2-4 min   Labs: Lab Results  Component Value Date   WBC 7.7 08/06/2019   HGB 13.2 08/06/2019   HCT 38.4 08/06/2019   MCV 83.8 08/06/2019   PLT 219 08/06/2019    Patient Active Problem List   Diagnosis Date Noted  . [redacted] weeks gestation of pregnancy 08/06/2019  . Labor and delivery, indication for care 08/06/2019  . Gestational diabetes 05/31/2019  . Polyhydramnios 05/28/2019  . LGA (large for gestational age) fetus affecting management of mother 05/28/2019  . Language barrier 04/02/2019  . Supervision of other normal pregnancy, antepartum 02/05/2019  . AMA (advanced maternal age) multigravida 35+ 02/05/2019    Assessment / Plan: 43 y.o. G6P5005 at [redacted]w[redacted]d here for SOL after SROM   Labor: Progressing well.  We will continue to monitor labor and consider Pitocin Fetal Wellbeing: Category 1, reassuring Pain Control: Natural at this time but epidural if desired Anticipated MOD: Vaginal  GDM A1 -Blood glucose monitoring every 4 hours  in latent labor and every 2 hours in active labor  gHTN  -Since admission patient has had multiple elevated blood pressures with no severe range pressures -If continues to have elevated pressures consider a urine protein creatinine and a CMP to evaluate for pre-E.  Gifford Shave, MD  PGY-1, [facility]  08/06/2019, 11:25 PM

## 2019-08-06 NOTE — H&P (Addendum)
OBSTETRIC ADMISSION HISTORY AND PHYSICAL  Olivia Raymond Olivia Raymond is a 43 y.o. female [phone] with IUP at [redacted]w[redacted]d by LMP presenting for SROM at 1900. Grossly ruptured on exam. Feeling minimal contractions and currently comfortable. She reports +FMs, no VB, no blurry vision, headaches or peripheral edema, and RUQ pain.  She plans on breast and bottle feeding. She requests Nexplanon for birth control. She received her prenatal care at [facility].   Dating: By LMP --->  Estimated Date of Delivery: 08/23/19  Sono: 07/28/2019  @[redacted]w[redacted]d , CWD, normal anatomy, cephalic presentation, anterior placental lie, 3640g, 99% EFW  Normal interval growth.  A1GDM  Polyhydramnios resolved  BPP 8/8  Good fetal movement and amniotic fluid volume.  AC in the 99th %.  Prenatal History/Complications: AMA GDMA1 Polyhydramnios EFW 99% COVID 2 months ago  Past Medical History: Past Medical History:  Diagnosis Date  . Depression   . Mental disorder     Past Surgical History: History reviewed. No pertinent surgical history.  Obstetrical History: OB History    Gravida  6   Para  5   Term  5   Preterm      AB      Living  5     SAB      TAB      Ectopic      Multiple      Live Births  5           Social History: Social History   Socioeconomic History  . Marital status: Married    Spouse name: Not on file  . Number of children: Not on file  . Years of education: Not on file  . Highest education level: Not on file  Occupational History  . Not on file  Social Needs  . Financial resource strain: Not on file  . Food insecurity    Worry: Not on file    Inability: Not on file  . Transportation needs    Medical: Not on file    Non-medical: Not on file  Tobacco Use  . Smoking status: Never Smoker  . Smokeless tobacco: Never Used  Substance and Sexual Activity  . Alcohol use: No  . Drug use: No  . Sexual activity: Yes  Lifestyle  . Physical activity    Days per week:  Not on file    Minutes per session: Not on file  . Stress: Not on file  Relationships  . Social Herbalist on phone: Not on file    Gets together: Not on file    Attends religious service: Not on file    Active member of club or organization: Not on file    Attends meetings of clubs or organizations: Not on file    Relationship status: Not on file  Other Topics Concern  . Not on file  Social History Narrative  . Not on file    Family History: History reviewed. No pertinent family history.  Allergies: No Known Allergies  Medications Prior to Admission  Medication Sig Dispense Refill Last Dose  . Prenatal Vit-Fe Fumarate-FA (MULTIVITAMIN-PRENATAL) 27-0.8 MG TABS tablet Take 1 tablet by mouth daily at 12 noon.   08/05/2019 at Unknown time  . Accu-Chek FastClix Lancets MISC 1 each by Percutaneous route 4 (four) times daily. 100 each 5   . glucose blood (ACCU-CHEK GUIDE) test strip Use 1 test strip to check glucose 4 times daily 100 each 12      Review of Systems  All systems reviewed and negative except as stated in HPI  Blood pressure (!) 127/91, temperature 98.2 F (36.8 C), temperature source Oral, resp. rate 18, height 5\' 4"  (1.626 m), weight 88 kg, last menstrual period 11/16/2018, unknown if currently breastfeeding. General appearance: alert, cooperative and appears stated age Lungs: normal effort Heart: regular rate  Abdomen: soft, non-tender; bowel sounds normal Pelvic: gravid uterus GU: No vaginal lesions  Extremities: Homans sign is negative, no sign of DVT Presentation: cephalic Fetal monitoringBaseline: 140 bpm, Variability: Good {> 6 bpm), Accelerations: Reactive and Decelerations: Absent Uterine activityNone Dilation: 4 Effacement (%): 50 Station: -2 Exam by:: Alitzel Cookson   Prenatal labs: ABO, Rh: O/Positive/-- (05/01 05-23-1998) Antibody: Negative (05/01 0914) Rubella: 5.68 (05/01 0914) RPR: Non Reactive (08/21 0952)  HBsAg: Negative (05/01 0914)   HIV: Non Reactive (08/21 06-20-1987)  GBS: --2831 (10/23 1106)  2 hr Glucola abnormal Genetic screening  NIPS normal female Anatomy 07-25-1973 WNL  Prenatal Transfer Tool  Maternal Diabetes: Yes:  Diabetes Type:  Diet controlled Genetic Screening: Normal Maternal Ultrasounds/Referrals: Normal Fetal Ultrasounds or other Referrals:  None Maternal Substance Abuse:  No Significant Maternal Medications:  None Significant Maternal Lab Results: Group B Strep negative  No results found for this or any previous visit (from the past 24 hour(s)).  Patient Active Problem List   Diagnosis Date Noted  . [redacted] weeks gestation of pregnancy 08/06/2019  . Gestational diabetes 05/31/2019  . Polyhydramnios 05/28/2019  . LGA (large for gestational age) fetus affecting management of mother 05/28/2019  . Language barrier 04/02/2019  . Supervision of other normal pregnancy, antepartum 02/05/2019  . AMA (advanced maternal age) multigravida 35+ 02/05/2019    Assessment/Plan:  Olivia Raymond is a 43 y.o. 46 at [redacted]w[redacted]d here for SROM at 1900 on 10/30.  #Labor: Vertex by exam. Not feeling many contractions currently. Shared-decision making. Will recheck in 2 hours and if patient has not progressed, will start Pitocin. Anticipate vaginal delivery.  #Pain: IV pain meds or Epidural as desires #FWB: Cat I; EFW: 4000g (99% on last 11/30). Proven to 9 lbs with roomy pelvis. #ID:  GBS neg. COVID 2 months ago; asymptomatic today - will not retest #MOF: Both #MOC: Nexplanon - (Adopt A Mom) - to be placed outpatient at HD #Circ:  Decline #GDMA1: Will monitor glucose q4h latent labor and q2h active labor.  [place], MD [facility] Family Medicine Fellow, [facility] for [facility], [facility] 08/06/2019, 9:15 PM

## 2019-08-07 ENCOUNTER — Encounter (HOSPITAL_COMMUNITY): Payer: Self-pay

## 2019-08-07 DIAGNOSIS — Z3A37 37 weeks gestation of pregnancy: Secondary | ICD-10-CM

## 2019-08-07 DIAGNOSIS — O24420 Gestational diabetes mellitus in childbirth, diet controlled: Secondary | ICD-10-CM

## 2019-08-07 LAB — CBC
HCT: 37.5 % (ref 36.0–46.0)
Hemoglobin: 12.7 g/dL (ref 12.0–15.0)
MCH: 28.9 pg (ref 26.0–34.0)
MCHC: 33.9 g/dL (ref 30.0–36.0)
MCV: 85.4 fL (ref 80.0–100.0)
Platelets: 221 10*3/uL (ref 150–400)
RBC: 4.39 MIL/uL (ref 3.87–5.11)
RDW: 14 % (ref 11.5–15.5)
WBC: 14.3 10*3/uL — ABNORMAL HIGH (ref 4.0–10.5)
nRBC: 0 % (ref 0.0–0.2)

## 2019-08-07 LAB — RPR: RPR Ser Ql: NONREACTIVE

## 2019-08-07 LAB — GLUCOSE, CAPILLARY: Glucose-Capillary: 71 mg/dL (ref 70–99)

## 2019-08-07 MED ORDER — ZOLPIDEM TARTRATE 5 MG PO TABS: 5.0000 mg | ORAL_TABLET | Freq: Every evening | ORAL | Status: DC | PRN

## 2019-08-07 MED ORDER — PRENATAL MULTIVITAMIN CH
1.0000 | ORAL_TABLET | Freq: Every day | ORAL | Status: DC
  Administered 2019-08-07 – 2019-08-08 (×2): 1 via ORAL
  Filled 2019-08-07 (×2): qty 1

## 2019-08-07 MED ORDER — DIBUCAINE (PERIANAL) 1 % EX OINT: 1.0000 "application " | TOPICAL_OINTMENT | CUTANEOUS | Status: DC | PRN

## 2019-08-07 MED ORDER — BENZOCAINE-MENTHOL 20-0.5 % EX AERO: 1.0000 "application " | INHALATION_SPRAY | CUTANEOUS | Status: DC | PRN

## 2019-08-07 MED ORDER — COCONUT OIL OIL: 1.0000 "application " | TOPICAL_OIL | Status: DC | PRN

## 2019-08-07 MED ORDER — SENNOSIDES-DOCUSATE SODIUM 8.6-50 MG PO TABS
2.0000 | ORAL_TABLET | ORAL | Status: DC
  Administered 2019-08-07: 2 via ORAL
  Filled 2019-08-07: qty 2

## 2019-08-07 MED ORDER — ONDANSETRON HCL 4 MG/2ML IJ SOLN: 4.0000 mg | INTRAMUSCULAR | Status: DC | PRN

## 2019-08-07 MED ORDER — SIMETHICONE 80 MG PO CHEW: 80.0000 mg | CHEWABLE_TABLET | ORAL | Status: DC | PRN

## 2019-08-07 MED ORDER — IBUPROFEN 600 MG PO TABS
600.0000 mg | ORAL_TABLET | Freq: Four times a day (QID) | ORAL | Status: DC
  Administered 2019-08-07 – 2019-08-08 (×6): 600 mg via ORAL
  Filled 2019-08-07 (×6): qty 1

## 2019-08-07 MED ORDER — DIPHENHYDRAMINE HCL 25 MG PO CAPS: 25.0000 mg | ORAL_CAPSULE | Freq: Four times a day (QID) | ORAL | Status: DC | PRN

## 2019-08-07 MED ORDER — ONDANSETRON HCL 4 MG PO TABS: 4.0000 mg | ORAL_TABLET | ORAL | Status: DC | PRN

## 2019-08-07 MED ORDER — ACETAMINOPHEN 325 MG PO TABS: 650.0000 mg | ORAL_TABLET | ORAL | Status: DC | PRN

## 2019-08-07 MED ORDER — WITCH HAZEL-GLYCERIN EX PADS: 1.0000 "application " | MEDICATED_PAD | CUTANEOUS | Status: DC | PRN

## 2019-08-07 MED ORDER — TETANUS-DIPHTH-ACELL PERTUSSIS 5-2.5-18.5 LF-MCG/0.5 IM SUSP: 0.5000 mL | Freq: Once | INTRAMUSCULAR | Status: DC

## 2019-08-07 NOTE — Discharge Summary (Signed)
Postpartum Discharge Summary    Patient Name: Olivia Raymond DOB: 1976-03-14 MRN: [id number]  Date of admission: 08/06/2019 Delivering Provider: Concepcion Living   Date of discharge: 08/08/2019  Admitting diagnosis: water Broke Intrauterine pregnancy: [redacted]w[redacted]d    Secondary diagnosis:  Active Problems:   AMA (advanced maternal age) multigravida 35+   Language barrier   Polyhydramnios   LGA (large for gestational age) fetus affecting management of mother   Gestational diabetes   [redacted] weeks gestation of pregnancy   Labor and delivery, indication for care  Additional problems: n/a     Discharge diagnosis: Term Pregnancy Delivered                                                                                                Post partum procedures:None  Augmentation: none  Complications: None  Hospital course:  Onset of Labor With Vaginal Delivery     43y.o. yo [id number] admitted in Latent Labor on 08/06/2019. Patient had an uncomplicated labor course as follows:  Membrane Rupture Time/Date:  ,   Intrapartum Procedures: Episiotomy: None [1]                                         Lacerations:  None [1]  Patient had a delivery of a Viable infant. 08/06/2019  Information for the patient's newborn:  GaWilbur, Labuda0[[id number]]Delivery Method: Vaginal, Spontaneous(Filed from Delivery Summary)     Pateint had an uncomplicated postpartum course.  She is ambulating, tolerating a regular diet, passing flatus, and urinating well. Patient is discharged home in stable condition on 08/08/19.  Delivery time: 11:53 PM    Magnesium Sulfate received: No BMZ received: No Rhophylac:No MMR:No Transfusion:No  Physical exam  Vitals:   08/07/19 1049 08/07/19 1515 08/07/19 2144 08/08/19 0549  BP: 114/78 116/85 115/83 102/72  Pulse: 73 68 65 71  Resp: '18 18 18 16  ' Temp: 98 F (36.7 C) 97.7 F (36.5 C) 97.7 F (36.5 C) 98.2 F (36.8 C)  TempSrc:  Oral Oral Oral Oral  SpO2:  100%  99%  Weight:      Height:       General: alert, cooperative and no distress Lochia: appropriate Uterine Fundus: firm DVT Evaluation: No evidence of DVT seen on physical exam. Labs: Lab Results  Component Value Date   WBC 14.3 (H) 08/07/2019   HGB 12.7 08/07/2019   HCT 37.5 08/07/2019   MCV 85.4 08/07/2019   PLT 221 08/07/2019   No flowsheet data found.  Discharge instruction: per After Visit Summary and "Baby and Me Booklet".  After visit meds:  Allergies as of 08/08/2019   No Known Allergies     Medication List    STOP taking these medications   Accu-Chek FastClix Lancets Misc   Accu-Chek Guide test strip Generic drug: glucose blood     TAKE these medications   multivitamin-prenatal 27-0.8 MG Tabs tablet Take 1 tablet by mouth daily at 12  noon.       Diet: routine diet  Activity: Advance as tolerated. Pelvic rest for 6 weeks.   Outpatient follow up:4 weeks Follow up Appt: Future Appointments  Date Time Provider [place]  08/10/2019 12:40 PM [place] [facility]-US  08/10/2019 12:45 PM Mohsin Crum [place] 2 WH-MFCUS MFC-US  08/12/2019  9:15 AM Chancy Milroy, MD [place] None  08/16/2019 11:30 AM WH-MFC NURSE WH-MFC MFC-US  08/16/2019 11:30 AM WH-MFC [place] 1 WH-MFCUS MFC-US  08/19/2019 11:00 AM Constant, Vickii Chafe, MD [facility] None   Follow up Visit:  Please schedule this patient for Postpartum visit in: 4 weeks with the following provider: Any provider For C/S patients schedule nurse incision check in weeks 2 weeks: no Low risk pregnancy complicated by: n/a Delivery mode:  SVD Anticipated Birth Control:  Nexplanon outpatient  PP Procedures needed: n/a  Schedule Integrated Schaller visit: no  Newborn Data: Live born female  Birth Weight: 3569g  APGAR: 30, 9  Newborn Delivery   Birth date/time: 08/06/2019 23:53:00 Delivery type: Vaginal, Spontaneous      Baby Feeding: Bottle and Breast Disposition:home with mother

## 2019-08-07 NOTE — Progress Notes (Signed)
Post Partum Day 1 Subjective: no complaints, up ad lib, voiding, tolerating PO and + flatus  Objective: Blood pressure 110/87, pulse 70, temperature 98.4 F (36.9 C), temperature source Oral, resp. rate 18, height 5\' 4"  (1.626 m), weight 88 kg, last menstrual period 11/16/2018, SpO2 98 %, unknown if currently breastfeeding.  Physical Exam:  General: alert, cooperative and no distress Lochia: appropriate Uterine Fundus: firm Incision: n/a DVT Evaluation: No evidence of DVT seen on physical exam.  Recent Labs    08/06/19 2109  HGB 13.2  HCT 38.4    Assessment/Plan: Plan for discharge tomorrow, Breastfeeding and Contraception Nexplanon at [facility]   LOS: 1 day   [place] 08/07/2019, 6:26 AM

## 2019-08-07 NOTE — Progress Notes (Signed)
CSW received consult for hx of Postpartum Depression.  CSW met with MOB to offer support and complete assessment.    When CSW arrived MOB was resting in bed bonding with infant; MOB and infant appeared comfortable and happy.  CSW utilize interpreting services (virtual [place] #[id number]) to assist with language barrier. MOB appeared happy, was easy to engage, and was receptive to meeting with CSW.   CSW asked about MOB's PMAD hx and MB acknowledged PMAD symptoms with MOB's older 5 children.  Per MOB, MOB experienced daily sadness, crying, and feelings of being overwhelmed.  MOB reported that MOB "Always" informed her OB provider and was often treated with medications (names unknown).   CSW provided education regarding the baby blues period vs. perinatal mood disorders, discussed treatment and gave resources for mental health follow up if concerns arise.  CSW recommends self-evaluation during the postpartum time period using the New Mom Checklist from Postpartum Progress and encouraged MOB to contact a medical professional if symptoms are noted at any time. MOB presented with insight an awareness and did not present with any acute MH symptoms.  MOB also reported comfortable seeking help if needed and shared having a good support team. CSW assessed for safety and MOB denied SI, HI, and DV.  CSW identifies no further need for intervention and no barriers to discharge at this time.  Astaria Nanez Boyd-Gilyard, MSW, LCSW Clinical Social Work [phone] 

## 2019-08-07 NOTE — Lactation Note (Signed)
This note was copied from a baby's chart. Lactation Consultation Note  Patient Name: Olivia Raymond Date: 08/07/2019 Reason for consult: Initial assessment;Early term 37-38.6wks GDM, AMA  LC in to visit with P6 Mom of ET infant at 1 hrs old.  Baby's birth weight was 7 lbs 13.9 oz.  Mom GDM diet- controlled and 2 CBGs were WNL.    Mom resting in bed, FOB on couch resting as well.  FOB states he will translate for Mom.  Mom very smiley and answered some of LC's questions.    Mom has successfully breastfed 5 other children for 9-12 months each (ages 73-18).  Mom understands about breast massage and hand expression.   This baby is sleeping swaddled on his back in his crib.  Encouraged Mom to keep baby STS when she is awake and alert.  Asked Mom to call with next feeding for her RN or LC to observe a latch to the breast.  Lactation brochure left in room.  Mom shown phone numbers on back for after discharge.     Consult Status Consult Status: Follow-up Date: 08/08/19    Broadus John 08/07/2019, 2:57 PM

## 2019-08-08 DIAGNOSIS — Z3A37 37 weeks gestation of pregnancy: Secondary | ICD-10-CM

## 2019-08-08 DIAGNOSIS — O24420 Gestational diabetes mellitus in childbirth, diet controlled: Secondary | ICD-10-CM

## 2019-08-08 LAB — GLUCOSE, CAPILLARY: Glucose-Capillary: 73 mg/dL (ref 70–99)

## 2019-08-08 NOTE — Progress Notes (Signed)
RN rounding and found pt asleep with infant in patient bed. Reminded patient of safe sleep and no co-sleeping. Placed infant in bassinet.

## 2019-08-08 NOTE — Lactation Note (Signed)
This note was copied from a baby's chart. Lactation Consultation Note  Patient Name: Olivia Raymond [id number] Date: 08/08/2019 Reason for consult: Follow-up assessment;Early term 37-38.6wks;Maternal endocrine disorder;Infant weight loss Type of Endocrine Disorder?: Diabetes(GDM)  12 hours old ETI female who is being partially BF and formula fed by his mother, she's a P6 and experienced BF. Mom and baby are going home today, he's at 5% weight loss. Per mom BF is going well but there's no recent LATCH score in chart. She has started giving baby some formula because she always had supply issues in the past. Stressed to mom the importance on continuing stimulation at the breast if giving baby formula either with hand expression or a breast pump.  Mom voiced she doesn't have a pump at home, offered a hand pump from the hospital, instructions, cleaning and storage were reviewed as well as milk storage guidelines. Offered assistance with latch but mom politely declined, baby was asleep, he already had a feeding of Similac 20 calorie formula. Encouraged mom to keep offering the breast on cues at least 8-12 times/24 hours, mom not doing STS when nursing baby, she has him completely swaddled; stressed out the importance of STS for a deep latch.  Reviewed discharge instructions, engorgement prevention/treatment, treatment/prevention for sore nipples, and red flags on when to call baby's pediatrician. Parents reported all questions and concerns were answered, they're both aware of [place] OP services and will contact PRN.  Maternal Data    Feeding Feeding Type: Bottle Fed - Formula Nipple Type: Slow - flow  LATCH Score                   Interventions Interventions: Breast feeding basics reviewed;Hand pump  Lactation Tools Discussed/Used Tools: Pump Breast pump type: Manual Pump Review: Setup, frequency, and cleaning;Milk Storage Initiated by:: MPeck Date initiated::  08/08/19   Consult Status Consult Status: Complete Date: 08/08/19 Follow-up type: Call as needed    Olivia Raymond 08/08/2019, 10:26 AM

## 2019-08-10 ENCOUNTER — Ambulatory Visit (HOSPITAL_COMMUNITY): Payer: Self-pay

## 2019-08-12 ENCOUNTER — Encounter: Payer: Self-pay | Admitting: Obstetrics and Gynecology

## 2019-08-16 ENCOUNTER — Encounter (HOSPITAL_COMMUNITY): Payer: Self-pay

## 2019-08-16 ENCOUNTER — Ambulatory Visit (HOSPITAL_COMMUNITY): Payer: Self-pay

## 2019-08-19 ENCOUNTER — Encounter: Payer: Self-pay | Admitting: Obstetrics and Gynecology

## 2019-09-09 ENCOUNTER — Ambulatory Visit (INDEPENDENT_AMBULATORY_CARE_PROVIDER_SITE_OTHER): Payer: Self-pay | Admitting: Obstetrics and Gynecology

## 2019-09-09 ENCOUNTER — Other Ambulatory Visit: Payer: Self-pay

## 2019-09-09 ENCOUNTER — Encounter: Payer: Self-pay | Admitting: Obstetrics and Gynecology

## 2019-09-09 DIAGNOSIS — Z789 Other specified health status: Secondary | ICD-10-CM

## 2019-09-09 DIAGNOSIS — O24410 Gestational diabetes mellitus in pregnancy, diet controlled: Secondary | ICD-10-CM

## 2019-09-09 DIAGNOSIS — Z3009 Encounter for other general counseling and advice on contraception: Secondary | ICD-10-CM

## 2019-09-09 DIAGNOSIS — Z3480 Encounter for supervision of other normal pregnancy, unspecified trimester: Secondary | ICD-10-CM

## 2019-09-09 DIAGNOSIS — Z1389 Encounter for screening for other disorder: Secondary | ICD-10-CM

## 2019-09-09 MED ORDER — MEDROXYPROGESTERONE ACETATE 150 MG/ML IM SUSP: 150.0000 mg | INTRAMUSCULAR | 3 refills | Status: DC

## 2019-09-09 MED ORDER — MEDROXYPROGESTERONE ACETATE 150 MG/ML IM SUSP
150.0000 mg | Freq: Once | INTRAMUSCULAR | Status: AC
  Administered 2019-09-29: 09:00:00 150 mg via INTRAMUSCULAR

## 2019-09-09 NOTE — Progress Notes (Signed)
Obstetrics/Postpartum Visit  Appointment Date: 09/09/2019  OBGYN Clinic: [facility]  Primary Care Provider: Patient, No Pcp Per  Chief Complaint:  Chief Complaint  Patient presents with  . Postpartum Care    History of Present Illness: Olivia Raymond is a 43 y.o. Hispanic [phone] (Patient's last menstrual period was 11/16/2018.), seen for the above chief complaint. Her past medical history is significant for n/a.  She is s/p SVD on 08/07/19 at 37 weeks; she was discharged to home on PPD#1. Pregnancy complicated by gDMA1, polyhydramnios, COVID19, AMA.  No complaints.  Vaginal bleeding or discharge: No  Breast or formula feeding: Both Intercourse: No  Contraception: Requests Depo PP depression s/s: "A little sad" EPDS = 3  Any bowel or bladder issues: No  Pap smear: no abnormalities (date: 01/2019)  Review of Systems: Positive for n/a.   Her 12 point review of systems is negative or as noted in the History of Present Illness.  Patient Active Problem List   Diagnosis Date Noted  . [redacted] weeks gestation of pregnancy 08/06/2019  . Labor and delivery, indication for care 08/06/2019  . Gestational diabetes 05/31/2019  . Polyhydramnios 05/28/2019  . LGA (large for gestational age) fetus affecting management of mother 05/28/2019  . Language barrier 04/02/2019  . Supervision of other normal pregnancy, antepartum 02/05/2019  . AMA (advanced maternal age) multigravida 35+ 02/05/2019    Medications Olivia Raymond had no medications administered during this visit. Current Outpatient Medications  Medication Sig Dispense Refill  . Prenatal Vit-Fe Fumarate-FA (MULTIVITAMIN-PRENATAL) 27-0.8 MG TABS tablet Take 1 tablet by mouth daily at 12 noon.    . medroxyPROGESTERone (DEPO-PROVERA) 150 MG/ML injection Inject 1 mL (150 mg total) into the muscle every 3 (three) months. 1 mL 3   Current Facility-Administered Medications  Medication Dose Route Frequency Provider  Last Rate Last Dose  . medroxyPROGESTERone (DEPO-PROVERA) injection 150 mg  150 mg Intramuscular Once Conan Bowens, MD        Allergies Patient has no known allergies.  Physical Exam:  BP 137/80   Pulse 80   Wt 177 lb 9.6 oz (80.6 kg)   LMP 11/16/2018   BMI 30.48 kg/m  Body mass index is 30.48 kg/m. General appearance: Well nourished, well developed female in no acute distress.  Cardiovascular: regular rate and rhythm Respiratory:  Normal respiratory effort Abdomen: no masses, hernias; diffusely non tender to palpation, non distended, Breasts: not examined. Neuro/Psych:  Normal mood and affect.  Skin:  Warm and dry.   PP Depression Screening:   Edinburgh Postnatal Depression Scale - 09/09/19 1311      Edinburgh Postnatal Depression Scale:  In the Past 7 Days   I have been able to laugh and see the funny side of things.  0    I have looked forward with enjoyment to things.  0    I have blamed myself unnecessarily when things went wrong.  0    I have been anxious or worried for no good reason.  2    I have felt scared or panicky for no good reason.  0    Things have been getting on top of me.  0    I have been so unhappy that I have had difficulty sleeping.  0    I have felt sad or miserable.  0    I have been so unhappy that I have been crying.  1    The thought of harming myself has occurred to  me.  0    Edinburgh Postnatal Depression Scale Total  3       Assessment: Patient is a 43 y.o. [id number] who is 4 weeks post partum from a SVD. She is doing well. Desires depo.  Plan:   1. Supervision of other normal pregnancy, antepartum - POCT urine pregnancy  2. Postpartum state Doing well  3. Encounter for counseling regarding contraception To [facility] for depo (adopt a mom)  4. Diet controlled gestational diabetes mellitus (GDM) in third trimester Return for 2 hr GTT Reviewed will need routine screening  5. Language barrier Patent attorney used  RTC 3 weeks for  2 hr GTT   K. Arvilla Meres, M.D. Attending Center for [employer] Fish farm manager)

## 2019-09-29 ENCOUNTER — Other Ambulatory Visit: Payer: Self-pay

## 2019-09-29 ENCOUNTER — Ambulatory Visit (INDEPENDENT_AMBULATORY_CARE_PROVIDER_SITE_OTHER): Payer: Self-pay

## 2019-09-29 DIAGNOSIS — O24410 Gestational diabetes mellitus in pregnancy, diet controlled: Secondary | ICD-10-CM

## 2019-09-29 DIAGNOSIS — Z3042 Encounter for surveillance of injectable contraceptive: Secondary | ICD-10-CM

## 2019-09-29 NOTE — Progress Notes (Unsigned)
Lab needs order for 2 gtt postpartum testing

## 2019-09-29 NOTE — Progress Notes (Signed)
I have reviewed this chart and agree with the RN/CMA assessment and management.    K. Meryl Bonne Whack, M.D. Attending Center for [facility] (Faculty Practice)   

## 2019-09-29 NOTE — Progress Notes (Signed)
Pt is in the office for depo injection, administered in L Del, and pt tolerated well. .. Administrations This Visit    medroxyPROGESTERone (DEPO-PROVERA) injection 150 mg    Admin Date 09/29/2019 Action Given Dose 150 mg Route Intramuscular Administered By Hinton Lovely, RN

## 2019-09-30 LAB — GLUCOSE TOLERANCE, 2 HOURS
Glucose, 2 hour: 98 mg/dL (ref 65–139)
Glucose, GTT - Fasting: 80 mg/dL (ref 65–99)

## 2019-12-28 ENCOUNTER — Ambulatory Visit (INDEPENDENT_AMBULATORY_CARE_PROVIDER_SITE_OTHER): Payer: Self-pay | Admitting: *Deleted

## 2019-12-28 ENCOUNTER — Other Ambulatory Visit: Payer: Self-pay

## 2019-12-28 DIAGNOSIS — Z3042 Encounter for surveillance of injectable contraceptive: Secondary | ICD-10-CM

## 2019-12-28 MED ORDER — MEDROXYPROGESTERONE ACETATE 150 MG/ML IM SUSP
150.0000 mg | Freq: Once | INTRAMUSCULAR | Status: AC
  Administered 2019-12-28: 150 mg via INTRAMUSCULAR

## 2019-12-28 NOTE — Progress Notes (Signed)
Patient seen and assessed by nursing staff during this encounter. I have reviewed the chart and agree with the documentation and plan. I have also made any necessary editorial changes.  Joselyn Arrow, MD 12/28/2019 9:44 AM

## 2019-12-28 NOTE — Progress Notes (Signed)
Pt is in office for depo injection.  Pt is on time for depo.  Injection given, pt tolerated well.  Pt advised to RTO 6/8-6/22/2021 for next depo.  Pt has no other concerns today.  Administrations This Visit    medroxyPROGESTERone (DEPO-PROVERA) injection 150 mg    Admin Date 12/28/2019 Action Given Dose 150 mg Route Intramuscular Administered By Lanney Gins, CMA

## 2020-01-12 IMAGING — US US MFM FETAL BPP W/O NON-STRESS
1 series · 12 of 24 positions shown · non-contrast
Comparison: none

[Series 1: us mfm fetal bpp w/o non-stress · 24 acquisitions, 12 frames shown]
[im 2/24]
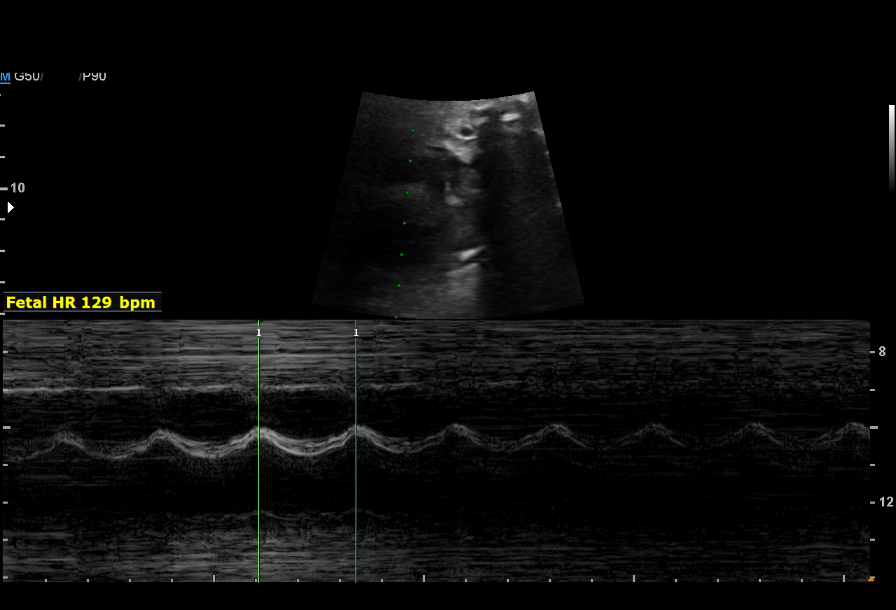
[im 4/24]
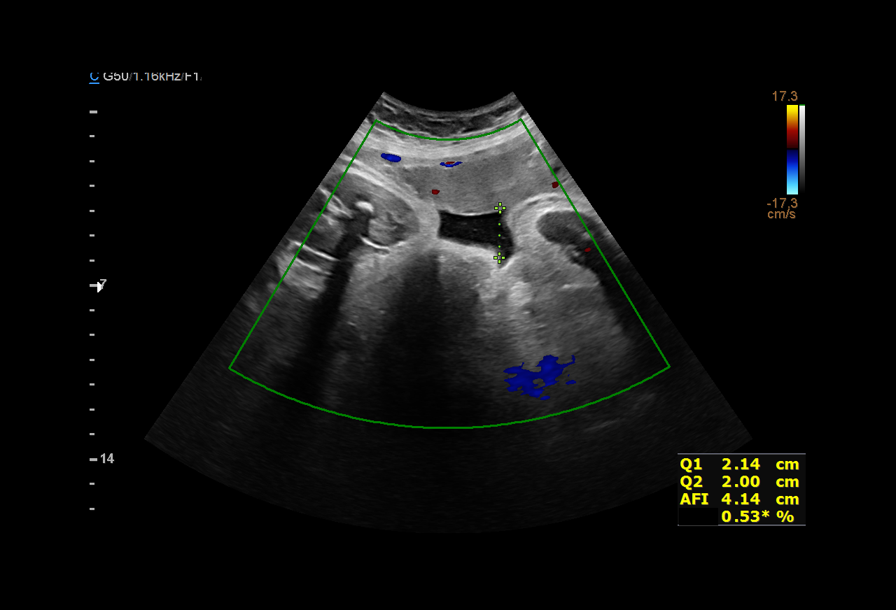
[im 6/24]
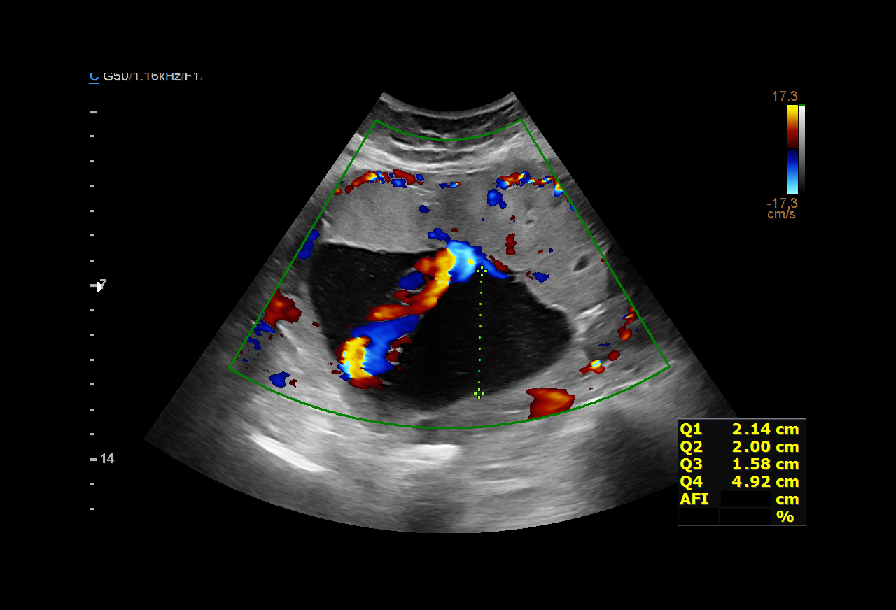
[im 8/24]
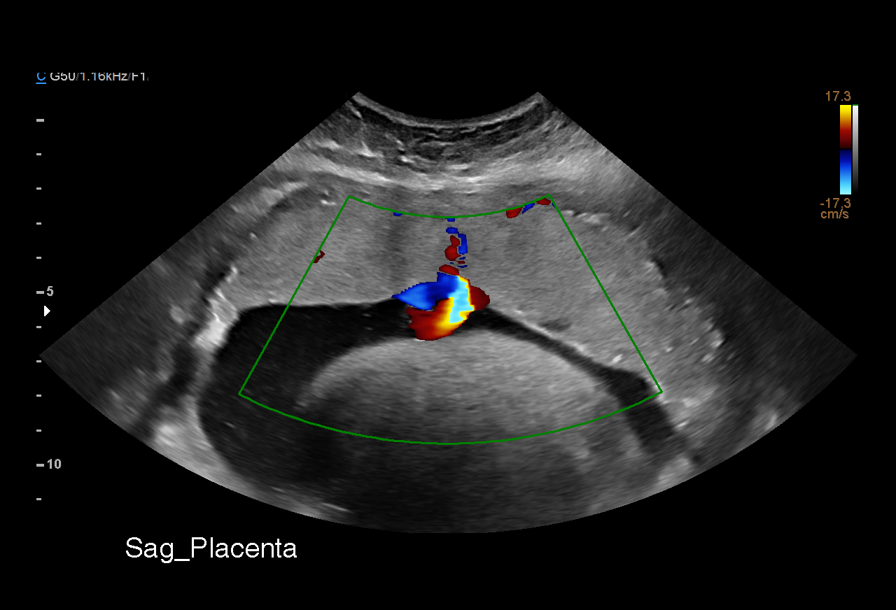
[im 10/24]
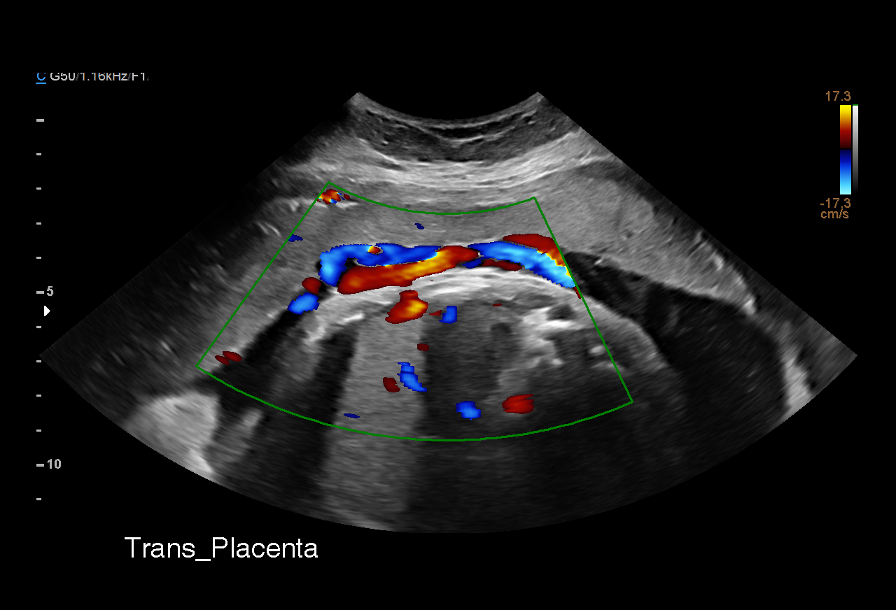
[im 12/24]
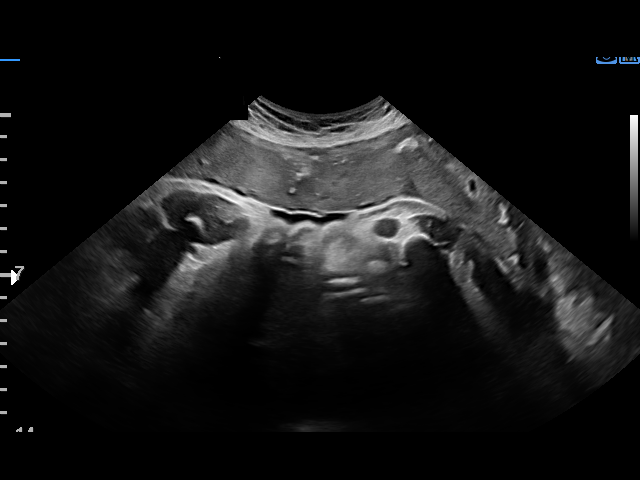
[im 14/24]
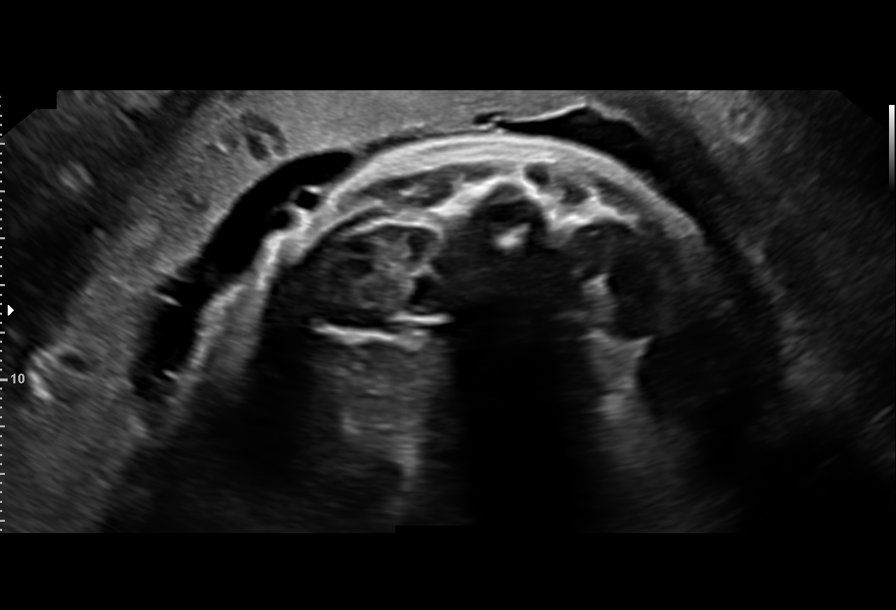
[im 16/24]
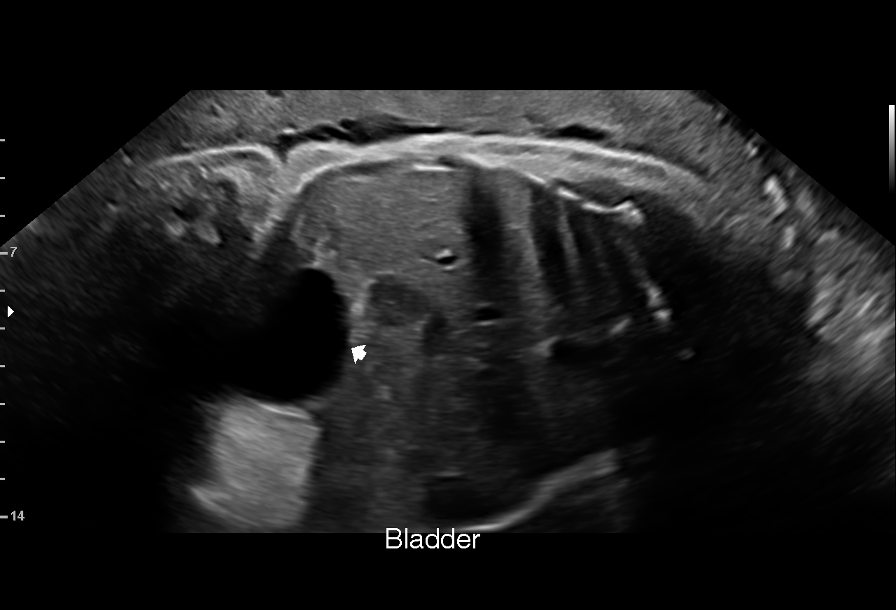
[im 18/24]
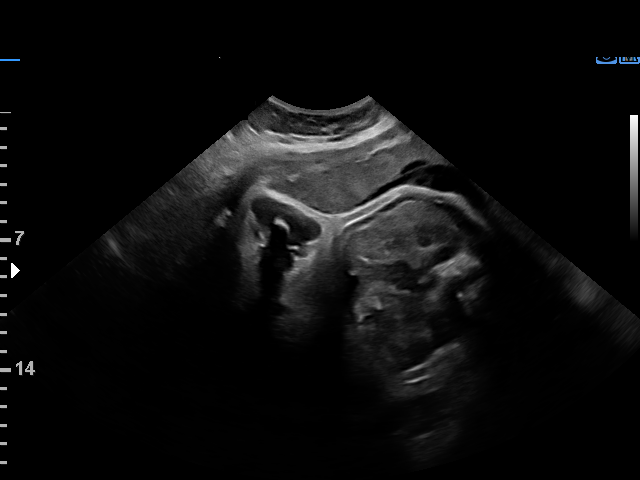
[im 20/24]
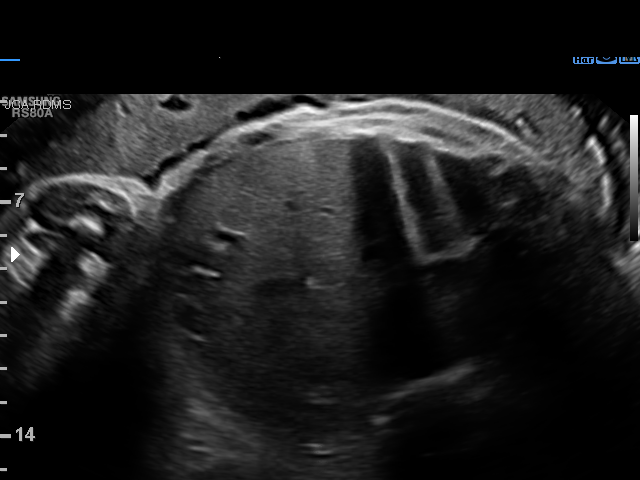
[im 22/24]
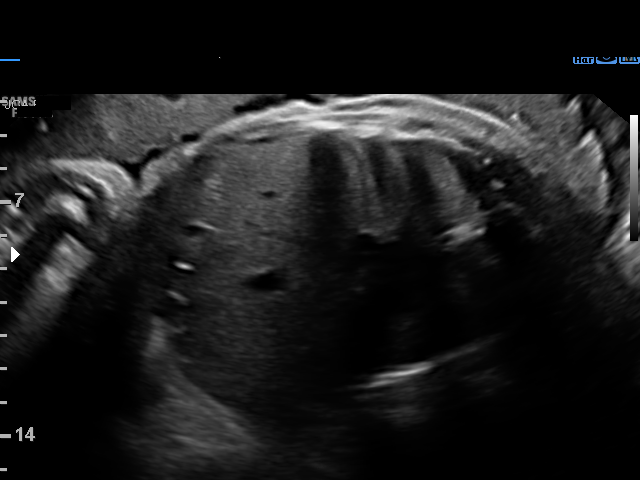
[im 24/24]
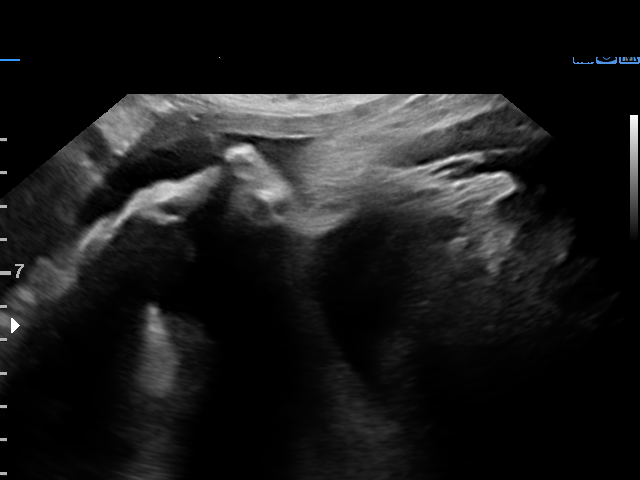

[12 of 24 positions shown; findings below may reference images not displayed]

[REDACTED]

 ----------------------------------------------------------------------

 ----------------------------------------------------------------------
Indications

  Polyhydramnios, third trimester, antepartum
  condition or complication, unspecified fetus
  (not seen on 06/29/19 ultrasound)
  Obesity complicating pregnancy, third
  trimester (Pre Pregnancy BMI 31)
  Gestational diabetes in pregnancy, diet
  controlled
  Advanced maternal age multigravida 35+,
  third trimester (Low risk NIPS)
  Large for gestational age fetus affecting
  management of mother
  Encounter for other antenatal screening
  follow-up
  37 weeks gestation of pregnancy
 ----------------------------------------------------------------------
Fetal Evaluation

 Num Of Fetuses:         1
 Fetal Heart Rate(bpm):  129
 Cardiac Activity:       Observed
 Presentation:           Cephalic
 Placenta:               Anterior
 P. Cord Insertion:      Previously Visualized

 Amniotic Fluid
 AFI FV:      Within normal limits

 AFI Sum(cm)     %Tile       Largest Pocket(cm)
 10.64           29
 RUQ(cm)       RLQ(cm)       LUQ(cm)        LLQ(cm)
 2.14          4.92          2
Biophysical Evaluation

 Amniotic F.V:   Within normal limits       F. Tone:        Observed
 F. Movement:    Observed                   Score:          [DATE]
 F. Breathing:   Observed
OB History

 Gravidity:    6         Term:   4         SAB:   1
 Living:       4
Gestational Age

 LMP:           37w 2d        Date:  11/16/18                 EDD:   08/23/19
 Best:          37w 2d     Det. By:  LMP  (11/16/18)          EDD:   08/23/19
Anatomy

 Diaphragm:             Appears normal         Kidneys:                Appear normal
 Stomach:               Appears normal, left   Bladder:                Appears normal
                        sided
 Abdomen:               Appears normal
Impression

 Biophysical profile [DATE]
 AMA 43 yo
 Good fetal movement
 A1 GDM
Recommendations

 Continue weekly testing
 Consider delivery 39 weeks.

## 2020-03-21 ENCOUNTER — Other Ambulatory Visit: Payer: Self-pay

## 2020-03-21 ENCOUNTER — Ambulatory Visit (INDEPENDENT_AMBULATORY_CARE_PROVIDER_SITE_OTHER): Payer: Self-pay

## 2020-03-21 VITALS — BP 116/78 | HR 101 | Wt 197.7 lb

## 2020-03-21 DIAGNOSIS — Z3042 Encounter for surveillance of injectable contraceptive: Secondary | ICD-10-CM

## 2020-03-21 MED ORDER — MEDROXYPROGESTERONE ACETATE 150 MG/ML IM SUSP
150.0000 mg | Freq: Once | INTRAMUSCULAR | Status: AC
  Administered 2020-03-21: 150 mg via INTRAMUSCULAR

## 2020-03-21 NOTE — Progress Notes (Signed)
Chart reviewed for nurse visit. Agree with plan of care.   Sharyon Cable, CNM 03/21/2020 11:11 AM

## 2020-03-21 NOTE — Progress Notes (Signed)
GYN presents for DEPO, given in LD, tolerated well. ° °Next DEPO August 31- Sept. 14, 2021 ° °Administrations This Visit   ° medroxyPROGESTERone (DEPO-PROVERA) injection 150 mg   ° Admin Date °03/21/2020 Action °Given Dose °150 mg Route °Intramuscular Administered By °Betti Goodenow J, RMA  °  °  °  ° °

## 2020-06-13 ENCOUNTER — Ambulatory Visit (INDEPENDENT_AMBULATORY_CARE_PROVIDER_SITE_OTHER): Payer: Self-pay

## 2020-06-13 ENCOUNTER — Other Ambulatory Visit: Payer: Self-pay

## 2020-06-13 VITALS — BP 107/74 | HR 92 | Wt 196.0 lb

## 2020-06-13 DIAGNOSIS — Z3042 Encounter for surveillance of injectable contraceptive: Secondary | ICD-10-CM

## 2020-06-13 MED ORDER — MEDROXYPROGESTERONE ACETATE 150 MG/ML IM SUSP
150.0000 mg | Freq: Once | INTRAMUSCULAR | Status: AC
  Administered 2020-06-13: 150 mg via INTRAMUSCULAR

## 2020-06-13 NOTE — Progress Notes (Signed)
Patient was assessed and managed by nursing staff during this encounter. I have reviewed the chart and agree with the documentation and plan. I have also made any necessary editorial changes.  Jaynie Collins, MD 06/13/2020 2:12 PM

## 2020-06-13 NOTE — Progress Notes (Signed)
SUBJECTIVE GYN presents for DEPO Injection, given in RD, tolerated well.   PLAN Next DEPO  Nov. 23-Dec. 7, 2021  Patient needs Annual Exam before next DEPO. Pt is aware and verbalized agreement.  Administrations This Visit    medroxyPROGESTERone (DEPO-PROVERA) injection 150 mg    Admin Date 06/13/2020 Action Given Dose 150 mg Route Intramuscular Administered By Maretta Bees, RMA

## 2020-08-28 ENCOUNTER — Other Ambulatory Visit: Payer: Self-pay | Admitting: Obstetrics and Gynecology

## 2020-08-29 ENCOUNTER — Ambulatory Visit (INDEPENDENT_AMBULATORY_CARE_PROVIDER_SITE_OTHER): Payer: Self-pay

## 2020-08-29 ENCOUNTER — Other Ambulatory Visit: Payer: Self-pay

## 2020-08-29 VITALS — BP 111/74 | HR 75

## 2020-08-29 DIAGNOSIS — Z3042 Encounter for surveillance of injectable contraceptive: Secondary | ICD-10-CM

## 2020-08-29 MED ORDER — MEDROXYPROGESTERONE ACETATE 150 MG/ML IM SUSP
150.0000 mg | Freq: Once | INTRAMUSCULAR | Status: AC
  Administered 2020-08-29: 150 mg via INTRAMUSCULAR

## 2020-08-29 NOTE — Progress Notes (Signed)
Gyn presents to the office for depo injection given in LD, tolerated well.  NDC: 49324-199-14  Plan: Next Depo: 11/14/2020-11/28/2020 Patient to schedule annual exam in the next couple of weeks.

## 2020-11-20 ENCOUNTER — Ambulatory Visit (INDEPENDENT_AMBULATORY_CARE_PROVIDER_SITE_OTHER): Payer: Self-pay

## 2020-11-20 ENCOUNTER — Other Ambulatory Visit: Payer: Self-pay

## 2020-11-20 VITALS — BP 112/74 | HR 93

## 2020-11-20 DIAGNOSIS — Z8751 Personal history of pre-term labor: Secondary | ICD-10-CM

## 2020-11-20 DIAGNOSIS — Z3042 Encounter for surveillance of injectable contraceptive: Secondary | ICD-10-CM

## 2020-11-20 MED ORDER — MEDROXYPROGESTERONE ACETATE 150 MG/ML IM SUSP
150.0000 mg | Freq: Once | INTRAMUSCULAR | Status: AC
  Administered 2020-11-20: 150 mg via INTRAMUSCULAR

## 2020-11-20 NOTE — Progress Notes (Signed)
Depo Provera 150mg  given IM R Deltoid. Next injection due 02/05/2021-02/19/2021. Pt tolerated well. No adverse side effects noted. Pt is due for annual exam. Information given to check out to schedule.

## 2021-02-06 ENCOUNTER — Other Ambulatory Visit: Payer: Self-pay

## 2021-02-06 ENCOUNTER — Ambulatory Visit (INDEPENDENT_AMBULATORY_CARE_PROVIDER_SITE_OTHER): Payer: Self-pay

## 2021-02-06 DIAGNOSIS — Z3042 Encounter for surveillance of injectable contraceptive: Secondary | ICD-10-CM

## 2021-02-06 MED ORDER — MEDROXYPROGESTERONE ACETATE 150 MG/ML IM SUSP
150.0000 mg | Freq: Once | INTRAMUSCULAR | Status: AC
  Administered 2021-02-06: 150 mg via INTRAMUSCULAR

## 2021-02-06 NOTE — Progress Notes (Signed)
Patient presented to the office for her depo-provera injection.  Given 150 mg in R Deltoid  Patient tolerated well and will follow up around 05/08/2021.  Patient advised to schedule an Annual visit. NDC # [phone]

## 2021-04-23 ENCOUNTER — Ambulatory Visit: Payer: Self-pay

## 2021-08-01 ENCOUNTER — Other Ambulatory Visit: Payer: Self-pay | Admitting: Obstetrics and Gynecology
